# Patient Record
Sex: Female | Born: 1990 | Race: Black or African American | Hispanic: No | Marital: Single | Smoking: Never smoker
Health system: Southern US, Community
[De-identification: ages and names within clinical notes are randomized; demographics above are authoritative.]

## PROBLEM LIST (undated history)

## (undated) DIAGNOSIS — Z789 Other specified health status: Secondary | ICD-10-CM

## (undated) HISTORY — DX: Other specified health status: Z78.9

## (undated) HISTORY — PX: NO PAST SURGERIES: SHX2092

---

## 2019-01-28 ENCOUNTER — Ambulatory Visit (INDEPENDENT_AMBULATORY_CARE_PROVIDER_SITE_OTHER): Payer: BC Managed Care – PPO | Admitting: *Deleted

## 2019-01-28 ENCOUNTER — Other Ambulatory Visit: Payer: Self-pay

## 2019-01-28 ENCOUNTER — Encounter: Payer: Self-pay | Admitting: General Practice

## 2019-01-28 VITALS — BP 120/83 | HR 86 | Temp 98.9°F | Ht 66.0 in | Wt 198.0 lb

## 2019-01-28 DIAGNOSIS — Z3201 Encounter for pregnancy test, result positive: Secondary | ICD-10-CM | POA: Diagnosis not present

## 2019-01-28 DIAGNOSIS — Z3400 Encounter for supervision of normal first pregnancy, unspecified trimester: Secondary | ICD-10-CM | POA: Insufficient documentation

## 2019-01-28 DIAGNOSIS — Z3200 Encounter for pregnancy test, result unknown: Secondary | ICD-10-CM

## 2019-01-28 LAB — POCT URINE PREGNANCY: Preg Test, Ur: POSITIVE — AB

## 2019-01-28 MED ORDER — VITAFOL GUMMIES 3.33-0.333-34.8 MG PO CHEW: 3.0000 | CHEWABLE_TABLET | Freq: Every day | ORAL | 12 refills | Status: AC

## 2019-01-28 NOTE — Patient Instructions (Addendum)
First Trimester of Pregnancy  The first trimester of pregnancy is from week 1 until the end of week 13 (months 1 through 3). During this time, your baby will begin to develop inside you. At 6-8 weeks, the eyes and face are formed, and the heartbeat can be seen on ultrasound. At the end of 12 weeks, all the baby's organs are formed. Prenatal care is all the medical care you receive before the birth of your baby. Make sure you get good prenatal care and follow all of your doctor's instructions. Follow these instructions at home: Medicines  Take over-the-counter and prescription medicines only as told by your doctor. Some medicines are safe and some medicines are not safe during pregnancy.  Take a prenatal vitamin that contains at least 600 micrograms (mcg) of folic acid.  If you have trouble pooping (constipation), take medicine that will make your stool soft (stool softener) if your doctor approves. Eating and drinking   Eat regular, healthy meals.  Your doctor will tell you the amount of weight gain that is right for you.  Avoid raw meat and uncooked cheese.  If you feel sick to your stomach (nauseous) or throw up (vomit): ? Eat 4 or 5 small meals a day instead of 3 large meals. ? Try eating a few soda crackers. ? Drink liquids between meals instead of during meals.  To prevent constipation: ? Eat foods that are high in fiber, like fresh fruits and vegetables, whole grains, and beans. ? Drink enough fluids to keep your pee (urine) clear or pale yellow. Activity  Exercise only as told by your doctor. Stop exercising if you have cramps or pain in your lower belly (abdomen) or low back.  Do not exercise if it is too hot, too humid, or if you are in a place of great height (high altitude).  Try to avoid standing for long periods of time. Move your legs often if you must stand in one place for a long time.  Avoid heavy lifting.  Wear low-heeled shoes. Sit and stand up straight.   You can have sex unless your doctor tells you not to. Relieving pain and discomfort  Wear a good support bra if your breasts are sore.  Take warm water baths (sitz baths) to soothe pain or discomfort caused by hemorrhoids. Use hemorrhoid cream if your doctor says it is okay.  Rest with your legs raised if you have leg cramps or low back pain.  If you have puffy, bulging veins (varicose veins) in your legs: ? Wear support hose or compression stockings as told by your doctor. ? Raise (elevate) your feet for 15 minutes, 3-4 times a day. ? Limit salt in your food. Prenatal care  Schedule your prenatal visits by the twelfth week of pregnancy.  Write down your questions. Take them to your prenatal visits.  Keep all your prenatal visits as told by your doctor. This is important. Safety  Wear your seat belt at all times when driving.  Make a list of emergency phone numbers. The list should include numbers for family, friends, the hospital, and police and fire departments. General instructions  Ask your doctor for a referral to a local prenatal class. Begin classes no later than at the start of month 6 of your pregnancy.  Ask for help if you need counseling or if you need help with nutrition. Your doctor can give you advice or tell you where to go for help.  Do not use hot  tubs, steam rooms, or saunas.  Do not douche or use tampons or scented sanitary pads.  Do not cross your legs for long periods of time.  Avoid all herbs and alcohol. Avoid drugs that are not approved by your doctor.  Do not use any tobacco products, including cigarettes, chewing tobacco, and electronic cigarettes. If you need help quitting, ask your doctor. You may get counseling or other support to help you quit.  Avoid cat litter boxes and soil used by cats. These carry germs that can cause birth defects in the baby and can cause a loss of your baby (miscarriage) or stillbirth.  Visit your dentist. At home,  brush your teeth with a soft toothbrush. Be gentle when you floss. Contact a doctor if:  You are dizzy.  You have mild cramps or pressure in your lower belly.  You have a nagging pain in your belly area.  You continue to feel sick to your stomach, you throw up, or you have watery poop (diarrhea).  You have a bad smelling fluid coming from your vagina.  You have pain when you pee (urinate).  You have increased puffiness (swelling) in your face, hands, legs, or ankles. Get help right away if:  You have a fever.  You are leaking fluid from your vagina.  You have spotting or bleeding from your vagina.  You have very bad belly cramping or pain.  You gain or lose weight rapidly.  You throw up blood. It may look like coffee grounds.  You are around people who have [place] measles, fifth disease, or chickenpox.  You have a very bad headache.  You have shortness of breath.  You have any kind of trauma, such as from a fall or a car accident. Summary  The first trimester of pregnancy is from week 1 until the end of week 13 (months 1 through 3).  To take care of yourself and your unborn baby, you will need to eat healthy meals, take medicines only if your doctor tells you to do so, and do activities that are safe for you and your baby.  Keep all follow-up visits as told by your doctor. This is important as your doctor will have to ensure that your baby is healthy and growing well. This information is not intended to replace advice given to you by your health care provider. Make sure you discuss any questions you have with your health care provider. Document Released: 11/22/2007 Document Revised: 09/26/2018 Document Reviewed: 06/13/2016 Elsevier Patient Education  2020 [employer]  Prenatal Care Prenatal care is health care during pregnancy. It helps you and your unborn baby (fetus) stay as healthy as possible. Prenatal care may be provided by a midwife, a family practice health  care provider, or a childbirth and pregnancy specialist (obstetrician). How does this affect me? During pregnancy, you will be closely monitored for any new conditions that might develop. To lower your risk of pregnancy complications, you and your health care provider will talk about any underlying conditions you have. How does this affect my baby? Early and consistent prenatal care increases the chance that your baby will be healthy during pregnancy. Prenatal care lowers the risk that your baby will be:  Born early (prematurely).  Smaller than expected at birth (small for gestational age). What can I expect at the first prenatal care visit? Your first prenatal care visit will likely be the longest. You should schedule your first prenatal care visit as soon as you know that you  are pregnant. Your first visit is a good time to talk about any questions or concerns you have about pregnancy. At your visit, you and your health care provider will talk about:  Your medical history, including: ? Any past pregnancies. ? Your family's medical history. ? The baby's father's medical history. ? Any long-term (chronic) health conditions you have and how you manage them. ? Any surgeries or procedures you have had. ? Any current over-the-counter or prescription medicines, herbs, or supplements you are taking.  Other factors that could pose a risk to your baby, including:  Your home setting and your stress levels, including: ? Exposure to abuse or violence. ? Household financial strain. ? Mental health conditions you have.  Your daily health habits, including diet and exercise. Your health care provider will also:  Measure your weight, height, and blood pressure.  Do a physical exam, including a pelvic and breast exam.  Perform blood tests and urine tests to check for: ? Urinary tract infection. ? Sexually transmitted infections (STIs). ? Low iron levels in your blood (anemia). ? Blood type and  certain proteins on red blood cells (Rh antibodies). ? Infections and immunity to viruses, such as hepatitis B and rubella. ? HIV (human immunodeficiency virus).  Do an ultrasound to confirm your baby's growth and development and to help predict your estimated due date (EDD). This ultrasound is done with a probe that is inserted into the vagina (transvaginal ultrasound).  Discuss your options for genetic screening.  Give you information about how to keep yourself and your baby healthy, including: ? Nutrition and taking vitamins. ? Physical activity. ? How to manage pregnancy symptoms such as nausea and vomiting (morning sickness). ? Infections and substances that may be harmful to your baby and how to avoid them. ? Food safety. ? Dental care. ? Working. ? Travel. ? Warning signs to watch for and when to call your health care provider. How often will I have prenatal care visits? After your first prenatal care visit, you will have regular visits throughout your pregnancy. The visit schedule is often as follows:  Up to week 28 of pregnancy: once every 4 weeks.  28-36 weeks: once every 2 weeks.  After 36 weeks: every week until delivery. Some women may have visits more or less often depending on any underlying health conditions and the health of the baby. Keep all follow-up and prenatal care visits as told by your health care provider. This is important. What happens during routine prenatal care visits? Your health care provider will:  Measure your weight and blood pressure.  Check for fetal heart sounds.  Measure the height of your uterus in your abdomen (fundal height). This may be measured starting around week 20 of pregnancy.  Check the position of your baby inside your uterus.  Ask questions about your diet, sleeping patterns, and whether you can feel the baby move.  Review warning signs to watch for and signs of labor.  Ask about any pregnancy symptoms you are having and  how you are dealing with them. Symptoms may include: ? Headaches. ? Nausea and vomiting. ? Vaginal discharge. ? Swelling. ? Fatigue. ? Constipation. ? Any discomfort, including back or pelvic pain. Make a list of questions to ask your health care provider at your routine visits. What tests might I have during prenatal care visits? You may have blood, urine, and imaging tests throughout your pregnancy, such as:  Urine tests to check for glucose, protein, or signs of infection.  Glucose tests to check for a form of diabetes that can develop during pregnancy (gestational diabetes mellitus). This is usually done around week 24 of pregnancy.  An ultrasound to check your baby's growth and development and to check for birth defects. This is usually done around week 20 of pregnancy.  A test to check for group B strep (GBS) infection. This is usually done around week 36 of pregnancy.  Genetic testing. This may include blood or imaging tests, such as an ultrasound. Some genetic tests are done during the first trimester and some are done during the second trimester. What else can I expect during prenatal care visits? Your health care provider may recommend getting certain vaccines during pregnancy. These may include:  A yearly flu shot (annual influenza vaccine). This is especially important if you will be pregnant during flu season.  Tdap (tetanus, diphtheria, pertussis) vaccine. Getting this vaccine during pregnancy can protect your baby from whooping cough (pertussis) after birth. This vaccine may be recommended between weeks 27 and 36 of pregnancy. Later in your pregnancy, your health care provider may give you information about:  Childbirth and breastfeeding classes.  Choosing a health care provider for your baby.  Umbilical cord banking.  Breastfeeding.  Birth control after your baby is born.  The hospital labor and delivery unit and how to tour it.  Registering at the hospital  before you go into labor. Where to find more information  Office on Women's Health: [place].cawomenshealth.gov  American Pregnancy Association: americanpregnancy.org  March of Dimes: marchofdimes.org Summary  Prenatal care helps you and your baby stay as healthy as possible during pregnancy.  Your first prenatal care visit will most likely be the longest.  You will have visits and tests throughout your pregnancy to monitor your health and your baby's health.  Bring a list of questions to your visits to ask your health care provider.  Make sure to keep all follow-up and prenatal care visits with your health care provider. This information is not intended to replace advice given to you by your health care provider. Make sure you discuss any questions you have with your health care provider. Document Released: 06/08/2003 Document Revised: 09/25/2018 Document Reviewed: 06/04/2017 Elsevier Patient Education  2020 [employer]  Warning Signs During Pregnancy A pregnancy lasts about 40 weeks, starting from the first day of your last period until the baby is born. Pregnancy is divided into three phases called trimesters.  The first trimester refers to week 1 through week 13 of pregnancy.  The second trimester is the start of week 14 through the end of week 27.  The third trimester is the start of week 28 until you deliver your baby. During each trimester of pregnancy, certain signs and symptoms may indicate a problem. Talk with your health care provider about your current health and any medical conditions you have. Make sure you know the symptoms that you should watch for and report. How does this affect me?  Warning signs in the first trimester While some changes during the first trimester may be uncomfortable, most do not represent a serious problem. Let your health care provider know if you have any of the following warning signs in the first trimester:  You cannot eat or drink without  vomiting, and this lasts for longer than a day.  You have vaginal bleeding or spotting along with menstrual-like cramping.  You have diarrhea for longer than a day.  You have a fever or other signs of infection, such  as: ? Pain or burning when you urinate. ? Foul smelling or thick or yellowish vaginal discharge. Warning signs in the second trimester As your baby grows and changes during the second trimester, there are additional signs and symptoms that may indicate a problem. These include:  Signs and symptoms of infection, including a fever.  Signs or symptoms of a miscarriage or preterm labor, such as regular contractions, menstrual-like cramping, or lower abdominal pain.  Bloody or watery vaginal discharge or obvious vaginal bleeding.  Feeling like your heart is pounding.  Having trouble breathing.  Nausea, vomiting, or diarrhea that lasts for longer than a day.  Craving non-food items, such as clay, chalk, or dirt. This may be a sign of a very treatable medical condition called pica. Later in your second trimester, watch for signs and symptoms of a serious medical condition called preeclampsia.These include:  Changes in your vision.  A severe headache that does not go away.  Nausea and vomiting. It is also important to notice if your baby stops moving or moves less than usual during this time. Warning signs in the third trimester As you approach the third trimester, your baby is growing and your body is preparing for the birth of your baby. In your third trimester, be sure to let your health care provider know if:  You have signs and symptoms of infection, including a fever.  You have vaginal bleeding.  You notice that your baby is moving less than usual or is not moving.  You have nausea, vomiting, or diarrhea that lasts for longer than a day.  You have a severe headache that does not go away.  You have vision changes, including seeing spots or having blurry or  double vision.  You have increased swelling in your hands or face. How does this affect my baby? Throughout your pregnancy, always report any of the warning signs of a problem to your health care provider. This can help prevent complications that may affect your baby, including:  Increased risk for premature birth.  Infection that may be transmitted to your baby.  Increased risk for stillbirth. Contact a health care provider if:  You have any of the warning signs of a problem for the current trimester of your pregnancy.  Any of the following apply to you during any trimester of pregnancy: ? You have strong emotions, such as sadness or anxiety, that interfere with work or personal relationships. ? You feel unsafe in your home and need help finding a safe place to live. ? You are using tobacco products, alcohol, or drugs and you need help to stop. Get help right away if: You have signs or symptoms of labor before 37 weeks of pregnancy. These include:  Contractions that are 5 minutes or less apart, or that increase in frequency, intensity, or length.  Sudden, sharp abdominal pain or low back pain.  Uncontrolled gush or trickle of fluid from your vagina. Summary  A pregnancy lasts about 40 weeks, starting from the first day of your last period until the baby is born. Pregnancy is divided into three phases called trimesters. Each trimester has warning signs to watch for.  Always report any warning signs to your health care provider in order to prevent complications that may affect both you and your baby.  Talk with your health care provider about your current health and any medical conditions you have. Make sure you know the symptoms that you should watch for and report. This information is not intended  to replace advice given to you by your health care provider. Make sure you discuss any questions you have with your health care provider. Document Released: 03/22/2017 Document Revised:  09/24/2018 Document Reviewed: 03/22/2017 Elsevier Patient Education  2020 [employer].  Eating Plan for Pregnant Women While you are pregnant, your body requires additional nutrition to help support your growing baby. You also have a higher need for some vitamins and minerals, such as folic acid, calcium, iron, and vitamin D. Eating a healthy, well-balanced diet is very important for your health and your baby's health. Your need for extra calories varies for the three 74-month segments of your pregnancy (trimesters). For most women, it is recommended to consume:  150 extra calories a day during the first trimester.  300 extra calories a day during the second trimester.  300 extra calories a day during the third trimester. What are tips for following this plan?   Do not try to lose weight or go on a diet during pregnancy.  Limit your overall intake of foods that have "empty calories." These are foods that have little nutritional value, such as sweets, desserts, candies, and sugar-sweetened beverages.  Eat a variety of foods (especially fruits and vegetables) to get a full range of vitamins and minerals.  Take a prenatal vitamin to help meet your additional vitamin and mineral needs during pregnancy, specifically for folic acid, iron, calcium, and vitamin D.  Remember to stay active. Ask your health care provider what types of exercise and activities are safe for you.  Practice good food safety and cleanliness. Wash your hands before you eat and after you prepare raw meat. Wash all fruits and vegetables well before peeling or eating. Taking these actions can help to prevent food-borne illnesses that can be very dangerous to your baby, such as listeriosis. Ask your health care provider for more information about listeriosis. What does 150 extra calories look like? Healthy options that provide 150 extra calories each day could be any of the following:  6-8 oz (170-230 g) of plain low-fat  yogurt with  cup of berries.  1 apple with 2 teaspoons (11 g) of peanut butter.  Cut-up vegetables with  cup (60 g) of hummus.  8 oz (230 mL) or 1 cup of low-fat chocolate milk.  1 stick of string cheese with 1 medium orange.  1 peanut butter and jelly sandwich that is made with one slice of whole-wheat bread and 1 tsp (5 g) of peanut butter. For 300 extra calories, you could eat two of those healthy options each day. What is a healthy amount of weight to gain? The right amount of weight gain for you is based on your BMI before you became pregnant. If your BMI:  Was less than 18 (underweight), you should gain 28-40 lb (13-18 kg).  Was 18-24.9 (normal), you should gain 25-35 lb (11-16 kg).  Was 25-29.9 (overweight), you should gain 15-25 lb (7-11 kg).  Was 30 or greater (obese), you should gain 11-20 lb (5-9 kg). What if I am having twins or multiples? Generally, if you are carrying twins or multiples:  You may need to eat 300-600 extra calories a day.  The recommended range for total weight gain is 25-54 lb (11-25 kg), depending on your BMI before pregnancy.  Talk with your health care provider to find out about nutritional needs, weight gain, and exercise that is right for you. What foods can I eat?  Grains All grains. Choose whole grains, such as whole-wheat  bread, oatmeal, or brown rice. Vegetables All vegetables. Eat a variety of colors and types of vegetables. Remember to wash your vegetables well before peeling or eating. Fruits All fruits. Eat a variety of colors and types of fruit. Remember to wash your fruits well before peeling or eating. Meats and other protein foods Lean meats, including chicken, [place], fish, and lean cuts of beef, veal, or pork. If you eat fish or seafood, choose options that are higher in omega-3 fatty acids and lower in mercury, such as salmon, herring, mussels, trout, sardines, pollock, shrimp, crab, and lobster. Tofu. Tempeh. Beans. Eggs.  Peanut butter and other nut butters. Make sure that all meats, poultry, and eggs are cooked to food-safe temperatures or "well-done." Two or more servings of fish are recommended each week in order to get the most benefits from omega-3 fatty acids that are found in seafood. Choose fish that are lower in mercury. You can find more information online:  [employer] Dairy Pasteurized milk and milk alternatives (such as almond milk). Pasteurized yogurt and pasteurized cheese. Cottage cheese. Sour cream. Beverages Water. Juices that contain 100% fruit juice or vegetable juice. Caffeine-free teas and decaffeinated coffee. Drinks that contain caffeine are okay to drink, but it is better to avoid caffeine. Keep your total caffeine intake to less than 200 mg each day (which is 12 oz or 355 mL of coffee, tea, or soda) or the limit as told by your health care provider. Fats and oils Fats and oils are okay to include in moderation. Sweets and desserts Sweets and desserts are okay to include in moderation. Seasoning and other foods All pasteurized condiments. The items listed above may not be a complete list of recommended foods and beverages. Contact your dietitian for more options. The items listed above may not be a complete list of foods and beverages [you/your child] can eat. Contact a dietitian for more information. What foods are not recommended? Vegetables Raw (unpasteurized) vegetable juices. Fruits Unpasteurized fruit juices. Meats and other protein foods Lunch meats, bologna, hot dogs, or other deli meats. (If you must eat those meats, reheat them until they are steaming hot.) Refrigerated pat, meat spreads from a meat counter, smoked seafood that is found in the refrigerated section of a store. Raw or undercooked meats, poultry, and eggs. Raw fish, such as sushi or sashimi. Fish that have high mercury content, such as tilefish, shark, swordfish, and king mackerel. To learn more about mercury  in fish, talk with your health care provider or look for online resources, such as:  [employer] Dairy Raw (unpasteurized) milk and any foods that have raw milk in them. Soft cheeses, such as feta, queso blanco, queso fresco, Brie, Camembert cheeses, blue-veined cheeses, and Panela cheese (unless it is made with pasteurized milk, which must be stated on the label). Beverages Alcohol. Sugar-sweetened beverages, such as sodas, teas, or energy drinks. Seasoning and other foods Homemade fermented foods and drinks, such as pickles, sauerkraut, or kombucha drinks. (Store-bought pasteurized versions of these are okay.) Salads that are made in a store or deli, such as ham salad, chicken salad, egg salad, tuna salad, and seafood salad. The items listed above may not be a complete list of foods and beverages to avoid. Contact your dietitian for more information. The items listed above may not be a complete list of foods and beverages [you/your child] should avoid. Contact a dietitian for more information. Where to find more information To calculate the number of calories you need based on your  height, weight, and activity level, you can use an online calculator such as:  PackageNews.iswww.choosemyplate.gov/MyPlatePlan To calculate how much weight you should gain during pregnancy, you can use an online pregnancy weight gain calculator such as:  http://jones-berg.com/www.choosemyplate.gov/pregnancy-weight-gain-calculator Summary  While you are pregnant, your body requires additional nutrition to help support your growing baby.  Eat a variety of foods, especially fruits and vegetables to get a full range of vitamins and minerals.  Practice good food safety and cleanliness. Wash your hands before you eat and after you prepare raw meat. Wash all fruits and vegetables well before peeling or eating. Taking these actions can help to prevent food-borne illnesses, such as listeriosis, that can be very dangerous to your baby.  Do not eat raw  meat or fish. Do not eat fish that have high mercury content, such as tilefish, shark, swordfish, and king mackerel. Do not eat unpasteurized (raw) dairy.  Take a prenatal vitamin to help meet your additional vitamin and mineral needs during pregnancy, specifically for folic acid, iron, calcium, and vitamin D. This information is not intended to replace advice given to you by your health care provider. Make sure you discuss any questions you have with your health care provider. Document Released: 03/20/2014 Document Revised: 09/26/2018 Document Reviewed: 03/02/2017 Elsevier Patient Education  2020 [employer]

## 2019-01-28 NOTE — Progress Notes (Signed)
   PRENATAL INTAKE SUMMARY  Nancy Hatfield presents today New OB Nurse Interview.  OB History    Gravida  2   Para      Term      Preterm      AB  1   Living        SAB      TAB      Ectopic      Multiple      Live Births             I have reviewed the patient's medical, obstetrical, social, and family histories, medications, and available lab results.  SUBJECTIVE She has no unusual complaints.  OBJECTIVE Initial history (New OB).  EDD: 09/24/2019 by LMP G2P0010 GA:[redacted]w[redacted]d  GENERAL APPEARANCE: alert, well appearing, in no apparent distress, oriented to person, place and time   ASSESSMENT Normal pregnancy. Positive Pregnancy test today.  PLAN Prenatal care- [facility] to be completed at next visit with midwife  Derl Barrow, RN

## 2019-03-12 ENCOUNTER — Other Ambulatory Visit: Payer: Self-pay | Admitting: Advanced Practice Midwife

## 2019-03-12 ENCOUNTER — Ambulatory Visit (INDEPENDENT_AMBULATORY_CARE_PROVIDER_SITE_OTHER): Payer: BC Managed Care – PPO | Admitting: Advanced Practice Midwife

## 2019-03-12 ENCOUNTER — Other Ambulatory Visit: Payer: Self-pay

## 2019-03-12 ENCOUNTER — Encounter: Payer: Self-pay | Admitting: Advanced Practice Midwife

## 2019-03-12 VITALS — BP 125/81 | HR 101 | Temp 98.8°F | Wt 200.5 lb

## 2019-03-12 DIAGNOSIS — Z3A12 12 weeks gestation of pregnancy: Secondary | ICD-10-CM

## 2019-03-12 DIAGNOSIS — Z113 Encounter for screening for infections with a predominantly sexual mode of transmission: Secondary | ICD-10-CM | POA: Diagnosis not present

## 2019-03-12 DIAGNOSIS — Z3481 Encounter for supervision of other normal pregnancy, first trimester: Secondary | ICD-10-CM

## 2019-03-12 DIAGNOSIS — Z3400 Encounter for supervision of normal first pregnancy, unspecified trimester: Secondary | ICD-10-CM

## 2019-03-12 DIAGNOSIS — Z124 Encounter for screening for malignant neoplasm of cervix: Secondary | ICD-10-CM

## 2019-03-12 DIAGNOSIS — Z3480 Encounter for supervision of other normal pregnancy, unspecified trimester: Secondary | ICD-10-CM

## 2019-03-12 NOTE — Progress Notes (Signed)
Subjective:   Addisynn Vassell is a 28 y.o. G2P0010 at [redacted]w[redacted]d by LMP being seen today for her first obstetrical visit.  Her obstetrical history is significant for none. Patient does intend to breast feed. Pregnancy history fully reviewed.  Patient reports no complaints.  HISTORY: OB History  Gravida Para Term Preterm AB Living  2 0 0 0 1 0  SAB TAB Ectopic Multiple Live Births  0 0 0 0 0    # Outcome Date GA Lbr Len/2nd Weight Sex Delivery Anes PTL Lv  2 Current           1 AB             Last pap smear was done 2018 and was normal per patient.   Past Medical History:  Diagnosis Date  . Medical history non-contributory    Past Surgical History:  Procedure Laterality Date  . NO PAST SURGERIES     Family History  Problem Relation Age of Onset  . Prostate cancer Father   . Hypertension Mother    Social History   Tobacco Use  . Smoking status: Never Smoker  . Smokeless tobacco: Never Used  Substance Use Topics  . Alcohol use: Not Currently  . Drug use: Never   Not on File Current Outpatient Medications on File Prior to Visit  Medication Sig Dispense Refill  . Prenatal Vit-Fe Phos-FA-Omega (VITAFOL GUMMIES) 3.33-0.333-34.8 MG CHEW Chew 3 each by mouth daily. 90 tablet 12   No current facility-administered medications on file prior to visit.     Review of Systems Pertinent items noted in HPI and remainder of comprehensive ROS otherwise negative.  Exam   Vitals:   03/12/19 1549  BP: 125/81  Pulse: (!) 101  Temp: 98.8 F (37.1 C)  Weight: 200 lb 8 oz (90.9 kg)     Physical Exam  Constitutional: She is oriented to person, place, and time and well-developed, well-nourished, and in no distress. No distress.  HENT:  Head: Normocephalic.  Cardiovascular: Normal rate.  Pulmonary/Chest: Effort normal.  Abdominal: Soft. There is no abdominal tenderness.  Genitourinary:    Genitourinary Comments:  External: no lesion Vagina: small amount of white  discharge Cervix: pink, smooth, no CMT Uterus: AGA Adnexa: NT    Neurological: She is alert and oriented to person, place, and time.  Skin: Skin is warm and dry.  Psychiatric: Affect normal.  Nursing note and vitals reviewed.   Assessment:   Pregnancy: G2P0010 Patient Active Problem List   Diagnosis Date Noted  . Supervision of normal first pregnancy, antepartum 01/28/2019     Plan:  1. Supervision of other normal pregnancy, antepartum - Routine care - Obstetric Panel, Including HIV - Culture, OB Urine - Genetic Screening - Enroll Patient in Babyscripts - Cytology - PAP( [place]) - Cervicovaginal ancillary only( [place]) - Next visit in 4 weeks can be virtual - Future orders placed for AFP and Anatomy US. Patient to get scheduled for lab visit on [place] day so that AFP can be drawn at that visit.    Initial labs drawn. Continue prenatal vitamins. Genetic Screening discussed, AFP and NIPS: requested. Ultrasound discussed; fetal anatomic survey: requested. Problem list reviewed and updated. The nature of [facility] with multiple MDs and other Advanced Practice Providers was explained to patient; also emphasized that residents, students are part of our team. Routine obstetric precautions reviewed. 50% of 45 min visit spent in counseling and coordination of care. Return in  about 4 weeks (around 04/09/2019) for virtual visit .  Marcille Buffy DNP, CNM  03/12/19  3:58 PM

## 2019-03-13 LAB — OBSTETRIC PANEL, INCLUDING HIV
Antibody Screen: NEGATIVE
Basophils Absolute: 0 10*3/uL (ref 0.0–0.2)
Basos: 1 %
EOS (ABSOLUTE): 0.1 10*3/uL (ref 0.0–0.4)
Eos: 2 %
HIV Screen 4th Generation wRfx: NONREACTIVE
Hematocrit: 34.5 % (ref 34.0–46.6)
Hemoglobin: 12 g/dL (ref 11.1–15.9)
Hepatitis B Surface Ag: NEGATIVE
Immature Grans (Abs): 0 10*3/uL (ref 0.0–0.1)
Immature Granulocytes: 0 %
Lymphocytes Absolute: 2.5 10*3/uL (ref 0.7–3.1)
Lymphs: 38 %
MCH: 31 pg (ref 26.6–33.0)
MCHC: 34.8 g/dL (ref 31.5–35.7)
MCV: 89 fL (ref 79–97)
Monocytes Absolute: 0.5 10*3/uL (ref 0.1–0.9)
Monocytes: 8 %
Neutrophils Absolute: 3.4 10*3/uL (ref 1.4–7.0)
Neutrophils: 51 %
Platelets: 242 10*3/uL (ref 150–450)
RBC: 3.87 x10E6/uL (ref 3.77–5.28)
RDW: 13.1 % (ref 11.7–15.4)
RPR Ser Ql: NONREACTIVE
Rh Factor: POSITIVE
Rubella Antibodies, IGG: 4.26 index (ref >0.99)
WBC: 6.6 10*3/uL (ref 3.4–10.8)

## 2019-03-14 LAB — URINE CULTURE, OB REFLEX

## 2019-03-14 LAB — CERVICOVAGINAL ANCILLARY ONLY
Chlamydia: NEGATIVE
Neisseria Gonorrhea: NEGATIVE

## 2019-03-14 LAB — CULTURE, OB URINE

## 2019-03-14 LAB — CYTOLOGY - PAP: Diagnosis: NEGATIVE

## 2019-03-17 ENCOUNTER — Encounter: Payer: Self-pay | Admitting: General Practice

## 2019-03-20 ENCOUNTER — Encounter: Payer: Self-pay | Admitting: General Practice

## 2019-03-25 ENCOUNTER — Encounter: Payer: Self-pay | Admitting: General Practice

## 2019-04-09 ENCOUNTER — Other Ambulatory Visit: Payer: Self-pay

## 2019-04-09 ENCOUNTER — Telehealth (INDEPENDENT_AMBULATORY_CARE_PROVIDER_SITE_OTHER): Payer: BC Managed Care – PPO

## 2019-04-09 DIAGNOSIS — Z3400 Encounter for supervision of normal first pregnancy, unspecified trimester: Secondary | ICD-10-CM

## 2019-04-09 DIAGNOSIS — Z3A16 16 weeks gestation of pregnancy: Secondary | ICD-10-CM

## 2019-04-09 DIAGNOSIS — O26892 Other specified pregnancy related conditions, second trimester: Secondary | ICD-10-CM

## 2019-04-09 DIAGNOSIS — R519 Headache, unspecified: Secondary | ICD-10-CM | POA: Insufficient documentation

## 2019-04-09 NOTE — Progress Notes (Signed)
TELEHEALTH OBSTETRICS PRENATAL VIRTUAL VIDEO VISIT ENCOUNTER NOTE  Provider location: [facility]   I connected with [name] on 04/09/19 at  9:30 AM EDT by MyChart Video Encounter at home and verified that I am speaking with the correct person using two identifiers.   I discussed the limitations, risks, security and privacy concerns of performing an evaluation and management service virtually and the availability of in person appointments. I also discussed with the patient that there may be a patient responsible charge related to this service. The patient expressed understanding and agreed to proceed. Subjective:  Nancy Hatfield is a 28 y.o. G2P0010 at [redacted]w[redacted]d being seen today for ongoing prenatal care.  She is currently monitored for the following issues for this low-risk pregnancy and has Supervision of normal first pregnancy, antepartum on their problem list.  Patient reports intermittent headaches that occur upon awaking.  Patient states the headaches are 4-5/10, but she does not usually take medications when they occur.  Patient goes on to state that when she does take tylenol she usually goes to sleep after and does not wake up with a headache.  Patient reports that the headaches occur about every other week and is a dull sensation in the back of her head or above her eyes.  Patient reports "trying" to drink 3 bottles of water daily and states on a good day she does as well as 2 bottles of sparkling water. Patient reports getting about 9 hrs of sleep daily.  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   Contractions: Not present. Vag. Bleeding: None.  Movement: Absent. Denies any leaking of fluid.   The following portions of the patient's history were reviewed and updated as appropriate: allergies, current medications, past family history, past medical history, past  social history, past surgical history and problem list.   Objective:  There were no vitals filed for this visit.  Fetal Status:     Movement: Absent     General:  Alert, oriented and cooperative. Patient is in no acute distress.  Respiratory: Normal respiratory effort, no problems with respiration noted  Mental Status: Normal mood and affect. Normal behavior. Normal judgment and thought content.  Rest of physical exam deferred due to type of encounter  Imaging: No results found.  Assessment and Plan:  Pregnancy: G2P0010 at [redacted]w[redacted]d 1. Supervision of normal first pregnancy, antepartum -Anticipatory guidance for upcoming appts. -Reviewed labs and informed no need for iron supplementation at current. -Informed that iron level will be rechecked at 28 week visit along with GTT. -Reviewed GTT including what to expect and how it's performed.  2. Headache in pregnancy, antepartum, second trimester -Reassured that it is common to have increase in headaches during pregnancy s/t fluctuations in hormones. -Encouraged to continue current mgmt and report no improvement with interventions or increase in frequency and/or duration of HA. -Reviewed how stressors (pregnancy, covid, grad school, and personal life) can contribute to headache onset and frequency. -Reviewed and discussed proper hydration throughout the day to avoid headaches. -Discussed that if change in headache pattern would refer to neurology and/or start alternate medications.   Preterm labor symptoms and general obstetric precautions including but not limited to vaginal bleeding, contractions, leaking of fluid and fetal movement were reviewed in detail with the patient. I discussed the assessment and treatment plan with the patient. The patient was provided an opportunity to ask questions and  all were answered. The patient agreed with the plan and demonstrated an understanding of the instructions. The patient was advised to call back or  seek an in-person office evaluation/go to MAU at [facility] for any urgent or concerning symptoms. Please refer to After Visit Summary for other counseling recommendations.   I provided 20 minutes of face-to-face time during this encounter.    Future Appointments  Date Time Provider [place]  04/30/2019  9:30 AM WOC-WOCA LAB WOC-WOCA WOC  04/30/2019 10:15 AM WH-MFC [place] 4 WH-MFCUS MFC-US  05/07/2019  1:10 PM Gavin Pound, [place], [place] for [employer], [place] [facility]

## 2019-04-24 ENCOUNTER — Other Ambulatory Visit: Payer: Self-pay | Admitting: Lactation Services

## 2019-04-24 DIAGNOSIS — Z3400 Encounter for supervision of normal first pregnancy, unspecified trimester: Secondary | ICD-10-CM

## 2019-04-24 NOTE — Progress Notes (Signed)
Lab orders

## 2019-04-30 ENCOUNTER — Other Ambulatory Visit: Payer: Self-pay | Admitting: Advanced Practice Midwife

## 2019-04-30 ENCOUNTER — Other Ambulatory Visit: Payer: Self-pay

## 2019-04-30 ENCOUNTER — Ambulatory Visit (HOSPITAL_COMMUNITY)
Admission: RE | Admit: 2019-04-30 | Discharge: 2019-04-30 | Disposition: A | Discharge status: Home / Self Care | Payer: BC Managed Care – PPO | Source: Ambulatory Visit | Attending: Advanced Practice Midwife | Admitting: Advanced Practice Midwife

## 2019-04-30 ENCOUNTER — Other Ambulatory Visit: Payer: BC Managed Care – PPO

## 2019-04-30 ENCOUNTER — Other Ambulatory Visit (HOSPITAL_COMMUNITY): Payer: Self-pay | Admitting: *Deleted

## 2019-04-30 DIAGNOSIS — Z3400 Encounter for supervision of normal first pregnancy, unspecified trimester: Secondary | ICD-10-CM

## 2019-04-30 DIAGNOSIS — O99212 Obesity complicating pregnancy, second trimester: Secondary | ICD-10-CM

## 2019-04-30 DIAGNOSIS — Z3A18 18 weeks gestation of pregnancy: Secondary | ICD-10-CM | POA: Diagnosis not present

## 2019-04-30 DIAGNOSIS — Z3480 Encounter for supervision of other normal pregnancy, unspecified trimester: Secondary | ICD-10-CM

## 2019-04-30 DIAGNOSIS — Z362 Encounter for other antenatal screening follow-up: Secondary | ICD-10-CM

## 2019-05-03 LAB — AFP, SERUM, OPEN SPINA BIFIDA
AFP MoM: 1.01
AFP Value: 45.9 ng/mL
Gest. Age on Collection Date: 19 weeks
Maternal Age At EDD: 29.3 yr
OSBR Risk 1 IN: 10000
Test Results:: NEGATIVE
Weight: 202 [lb_av]

## 2019-05-07 ENCOUNTER — Other Ambulatory Visit: Payer: Self-pay

## 2019-05-07 ENCOUNTER — Telehealth (INDEPENDENT_AMBULATORY_CARE_PROVIDER_SITE_OTHER): Payer: BC Managed Care – PPO

## 2019-05-07 VITALS — BP 111/89 | HR 84 | Wt 205.2 lb

## 2019-05-07 DIAGNOSIS — O99210 Obesity complicating pregnancy, unspecified trimester: Secondary | ICD-10-CM | POA: Insufficient documentation

## 2019-05-07 DIAGNOSIS — O26892 Other specified pregnancy related conditions, second trimester: Secondary | ICD-10-CM

## 2019-05-07 DIAGNOSIS — O99212 Obesity complicating pregnancy, second trimester: Secondary | ICD-10-CM

## 2019-05-07 DIAGNOSIS — Z3400 Encounter for supervision of normal first pregnancy, unspecified trimester: Secondary | ICD-10-CM

## 2019-05-07 DIAGNOSIS — E669 Obesity, unspecified: Secondary | ICD-10-CM | POA: Insufficient documentation

## 2019-05-07 DIAGNOSIS — Z3A20 20 weeks gestation of pregnancy: Secondary | ICD-10-CM

## 2019-05-07 DIAGNOSIS — R519 Headache, unspecified: Secondary | ICD-10-CM

## 2019-05-07 DIAGNOSIS — Z9189 Other specified personal risk factors, not elsewhere classified: Secondary | ICD-10-CM

## 2019-05-07 MED ORDER — ASPIRIN 81 MG PO CHEW: 81.0000 mg | CHEWABLE_TABLET | Freq: Every day | ORAL | 1 refills | Status: DC

## 2019-05-07 NOTE — Patient Instructions (Signed)

## 2019-05-07 NOTE — Progress Notes (Signed)
TELEHEALTH OBSTETRICS PRENATAL VIRTUAL VIDEO VISIT ENCOUNTER NOTE  Provider location: [facility]   I connected with [name] on 05/07/19 at 11:10 AM EST by MyChart Video Encounter at home and verified that I am speaking with the correct person using two identifiers.   I discussed the limitations, risks, security and privacy concerns of performing an evaluation and management service virtually and the availability of in person appointments. I also discussed with the patient that there may be a patient responsible charge related to this service. The patient expressed understanding and agreed to proceed. Subjective:  Nancy Hatfield is a 28 y.o. G2P0010 at [redacted]w[redacted]d being seen today for ongoing prenatal care.  She is currently monitored for the following issues for this low-risk pregnancy and has Supervision of normal first pregnancy, antepartum; Headache in pregnancy, antepartum, second trimester; and Obesity in pregnancy on their problem list.  Patient reports headache.  She states that she continues to get daily mild headaches despite adequate sleep and hydration.  She reports fetal movement that is perceived as "flutters or tickles."  She reports that she has been feeling very emotional lately.  She reports immense dissatisfaction with her anatomy US experience and the inability to have her SO at the bedside or present via phone. She states she felt technicians and front office staff lacked compassion although she understood the reason for not having her SO present.  She also questioned why her SO could not be present via facetime and went on to state that exceptions should be made in setting of CoVid.  In regards to overall health, patient reports increased feeling of hunger and questions if this is normal.   Contractions: Not present. Vag. Bleeding: None.  Movement: Present. Denies any leaking of fluid.   The following portions of the patient's history were  reviewed and updated as appropriate: allergies, current medications, past family history, past medical history, past social history, past surgical history and problem list.   Objective:   Vitals:   05/07/19 1116  BP: 111/89  Pulse: 84  Weight: 205 lb 3.2 oz (93.1 kg)    Fetal Status:     Movement: Present     General:  Alert, oriented and cooperative. Patient is in no acute distress.  Respiratory: Normal respiratory effort, no problems with respiration noted  Mental Status: Normal mood and affect. Normal behavior. Normal judgment and thought content.  Rest of physical exam deferred due to type of encounter  Imaging: [place] Mfm Ob Detail +14 Wk  Result Date: 04/30/2019 ----------------------------------------------------------------------  OBSTETRICS REPORT                       (Signed Final 04/30/2019 12:15 pm) ---------------------------------------------------------------------- Patient Info  ID #:       [id number]                          D.O.B.:  04-23-1991 (28 yrs)  Name:       Nancy Hatfield                  Visit Date: 04/30/2019 10:44 am ---------------------------------------------------------------------- Performed By  Performed By:     Emeline Darling BS,      Ref. Address:     [address]                    [place],                                                             [place] [address]  Attending:        Noralee Space MD        [facility]  Referred By:      Armando Reichert CNM ---------------------------------------------------------------------- Orders   #  Description                          Code         Ordered By   1  [place] MFM OB DETAIL +14 WK              76811.01     Thressa Sheller  ----------------------------------------------------------------------   #  Order #                    Accession #                  Episode #   1  [id number]                  [id number]                  [id number]  ---------------------------------------------------------------------- Indications   Obesity complicating pregnancy, second         O99.212   trimester   Encounter for antenatal screening for          Z36.3   malformations   Negative Horizon (Low Risk NIPS)   [redacted] weeks gestation of pregnancy                Z3A.18  ---------------------------------------------------------------------- Vital Signs  Weight (lb): 200                               Height:        5'6"  BMI:         32.28 ---------------------------------------------------------------------- Fetal Evaluation  Num Of Fetuses:         1  Fetal Heart Rate(bpm):  155  Cardiac Activity:       Observed  Presentation:           Cephalic  Placenta:               Anterior  P. Cord Insertion:      Visualized  Amniotic Fluid  AFI FV:      Within normal limits                              Largest Pocket(cm)  6.1 ---------------------------------------------------------------------- Biometry  BPD:      40.9  mm     G. Age:  18w 3d         63  %    CI:        76.46   %    70 - 86                                                          FL/HC:      17.9   %    15.8 - 18  HC:      148.2  mm     G. Age:  18w 0d         31  %    HC/AC:      1.16        1.07 - 1.29  AC:      127.4  mm     G. Age:  18w 2d         53  %    FL/BPD:     64.8   %  FL:       26.5  mm     G. Age:  18w 0d         41  %    FL/AC:      20.8   %    20 - 24  Est. FW:     228  gm      0 lb 8 oz     48  % ---------------------------------------------------------------------- OB History  Gravidity:    2         Term:   0        Prem:   0        SAB:   1  TOP:          0       Ectopic:  0        Living: 0 ---------------------------------------------------------------------- Gestational Age  LMP:           19w 0d        Date:  12/18/18                 EDD:   09/24/19  U/S Today:     18w 1d                                         EDD:   09/30/19  Best:          18w 1d     Det. By:  U/S (04/30/19)           EDD:   09/30/19 ---------------------------------------------------------------------- Anatomy  Cranium:               Appears normal         LVOT:                   Not well visualized  Cavum:                 Not well visualized    Aortic Arch:            Appears normal  Ventricles:  Not well visualized    Ductal Arch:            Appears normal  Choroid Plexus:        Appears normal         Diaphragm:              Appears normal  Cerebellum:            Previously seen        Stomach:                Appears normal, left                                                                        sided  Posterior Fossa:       Previously seen        Abdomen:                Appears normal  Nuchal Fold:           Previously seen        Abdominal Wall:         Appears nml (cord                                                                        insert, abd wall)  Face:                  Appears normal         Cord Vessels:           Appears normal (3                         (orbits and profile)                           vessel cord)  Lips:                  Not well visualized    Kidneys:                Appear normal  Palate:                Not well visualized    Bladder:                Appears normal  Thoracic:              Appears normal         Spine:                  Appears normal  Heart:                 Appears normal         Upper Extremities:      Appears normal                         (  4CH, axis, and                         situs)  RVOT:                  Not well visualized    Lower Extremities:      Appears normal  Other:  Female gender. Heels visualized. Technically difficult due to maternal          habitus and fetal position. ---------------------------------------------------------------------- Cervix Uterus Adnexa  Cervix  Length:              3  cm.  Normal appearance by transabdominal  scan. ---------------------------------------------------------------------- Impression  We performed fetal anatomy scan. No makers of  aneuploidies or fetal structural defects are seen. Fetal  biometry is consistent with her previously-established dates.  Amniotic fluid is normal and good fetal activity is seen.  Patient understands the limitations of ultrasound in detecting  fetal anomalies.  On cell-free fetal DNA screening, the risks of fetal  aneuploidies are not increased. ---------------------------------------------------------------------- Recommendations  -An appointment was made for her to return in 4 weeks for  completion of fetal anatomy. ----------------------------------------------------------------------                  Noralee Space, MD Electronically Signed Final Report   04/30/2019 12:15 pm ----------------------------------------------------------------------   Assessment and Plan:  Pregnancy: G2P0010 at [redacted]w[redacted]d 1. Supervision of normal first pregnancy, antepartum -Anticipatory guidance for upcoming appts. -Informed that next appt would be a virtual visit f/b in office visit for GTT. -Discussed that appt in 8 weeks would last at least 2.5 hours.  -Reiterated social distancing precautions during upcoming holiday season.  Patient informed that she is considered immunocompromised during pregnancy. -Reviewed ultrasound results including incomplete findings as reason for repeat in 4 weeks.  -Discussed taking blood pressures at least once a week, but okay to take when headaches occur.   2. Headache in pregnancy, antepartum, second trimester -Discussed plan to place neurology referral for assessment of headaches. -Reassured that it is normal to have an increase in HA during pregnancy, but the concern is with the daily occurrence despite the intensity. -Patient agreeable with plan and has no questions or concerns.  - Ambulatory referral to Neurology  3. Obesity in pregnancy -Reviewed  current weight and informed of overall weight gain of 5lbs. -Discussed weight gain goal of 11-20lbs max. -Informed that it is normal to experience periods of increased hunger throughout the pregnancy. -Discussed making health conscious meal and snack choices. -Discussed protocol of bASA during pregnancy to reduce risk of PreEclampsia. Reviewed current risk factors for PreEclampsia including weight, AA descent, and nulliparity. -Patient agreeable and questions addressed. -Rx for baby aspirin sent to pharmacy on file.   -Will receive CMP at next in office visit.  4. At risk for dissatisfaction with healthcare -Apologies given for patient's experience during anatomy US. -Reassured that all patient's are under the same restrictions. -Informed that cellphone policy was established prior to COVID 19. -Patient informed that complaint would be filed on her behalf.   Preterm labor symptoms and general obstetric precautions including but not limited to vaginal bleeding, contractions, leaking of fluid and fetal movement were reviewed in detail with the patient. I discussed the assessment and treatment plan with the patient. The patient was provided an opportunity to ask questions and all were answered. The patient agreed with the plan and demonstrated an understanding of the instructions. The patient was advised  to call back or seek an in-person office evaluation/go to MAU at [facility] for any urgent or concerning symptoms. Please refer to After Visit Summary for other counseling recommendations.   I provided 31 minutes of face-to-face time during this encounter.  No follow-ups on file.  Future Appointments  Date Time Provider Department Center  05/28/2019 11:15 AM WH-MFC [place] 4 WH-MFCUS MFC-US  05/28/2019 11:20 AM WH-MFC NURSE WH-MFC MFC-US  06/04/2019  9:50 AM Armando Reichert, CNM [facility] None  07/02/2019  8:10 AM Nancy Cable, CNM [facility] None    Cherre Robins, CNM [facility] [employer], [facility]

## 2019-05-13 ENCOUNTER — Ambulatory Visit: Payer: BC Managed Care – PPO | Admitting: Diagnostic Neuroimaging

## 2019-05-13 ENCOUNTER — Other Ambulatory Visit: Payer: Self-pay

## 2019-05-13 ENCOUNTER — Encounter: Payer: Self-pay | Admitting: Diagnostic Neuroimaging

## 2019-05-13 VITALS — BP 118/76 | HR 107 | Temp 97.4°F | Ht 65.0 in | Wt 210.0 lb

## 2019-05-13 DIAGNOSIS — R519 Headache, unspecified: Secondary | ICD-10-CM | POA: Diagnosis not present

## 2019-05-13 DIAGNOSIS — G44209 Tension-type headache, unspecified, not intractable: Secondary | ICD-10-CM | POA: Diagnosis not present

## 2019-05-13 DIAGNOSIS — O26892 Other specified pregnancy related conditions, second trimester: Secondary | ICD-10-CM

## 2019-05-13 NOTE — Progress Notes (Signed)
[facility]  PATIENT: Nancy Hatfield DOB: 02-16-1991  REFERRING CLINICIAN: Gavin Pound HISTORY FROM: patient  REASON FOR VISIT: new consult    HISTORICAL  CHIEF COMPLAINT:  Chief Complaint  Patient presents with  . Headache in Pregnancy    rm 7 New Pt, [redacted] weeks pregnant "headaches in pregnancy, intermittent times of day, aches around my head like a sitting ache, no vision changes, no nausea"    HISTORY OF PRESENT ILLNESS:   28 year old female G2, P1, [redacted] weeks pregnant, here for evaluation of headaches.  Symptoms of headache started about 2 months ago and consist of frontal or unilateral pressure headaches sometimes throbbing, worse with standing up or moving quickly.  No nausea or vomiting with headaches.  No vision changes, loss of vision or scintillating scotoma.  No slurred speech, numbness or dizziness.  No prior similar headaches.  Headaches can occur 2 times per week.  She has tried Tylenol at times without relief.  Blood pressure has been normal at home and with office visits.  Stress levels are slightly increased as she is trying to complete her masters degree in [employer] currently.    REVIEW OF SYSTEMS: Full 14 system review of systems performed and negative with exception of: As per HPI.  ALLERGIES: No Known Allergies  HOME MEDICATIONS: Outpatient Medications Prior to Visit  Medication Sig Dispense Refill  . aspirin 81 MG chewable tablet Chew 1 tablet (81 mg total) by mouth daily. 60 tablet 1  . Prenatal Vit-Fe Phos-FA-Omega (VITAFOL GUMMIES) 3.33-0.333-34.8 MG CHEW Chew 3 each by mouth daily. 90 tablet 12   No facility-administered medications prior to visit.     PAST MEDICAL HISTORY: Past Medical History:  Diagnosis Date  . Medical history non-contributory     PAST SURGICAL HISTORY: Past Surgical History:  Procedure Laterality Date  . NO PAST SURGERIES      FAMILY HISTORY: Family History  Problem Relation Age of Onset  .  Prostate cancer Father   . Hypertension Mother     SOCIAL HISTORY: Social History   Socioeconomic History  . Marital status: Single    Spouse name: Jeneen Rinks   . Number of children: 0  . Years of education: last year of master's degree  . Highest education level: Bachelor's degree (e.g., BA, AB, BS)  Occupational History  . Not on file  Social Needs  . Financial resource strain: Patient refused  . Food insecurity    Worry: Patient refused    Inability: Patient refused  . Transportation needs    Medical: Patient refused    Non-medical: Patient refused  Tobacco Use  . Smoking status: Never Smoker  . Smokeless tobacco: Never Used  Substance and Sexual Activity  . Alcohol use: Not Currently  . Drug use: Not Currently    Types: Marijuana    Comment: 05/13/19 recently stopped  . Sexual activity: Yes    Birth control/protection: None  Lifestyle  . Physical activity    Days per week: Patient refused    Minutes per session: Patient refused  . Stress: Patient refused  Relationships  . Social Herbalist on phone: Patient refused    Gets together: Patient refused    Attends religious service: Patient refused    Active member of club or organization: Patient refused    Attends meetings of clubs or organizations: Patient refused    Relationship status: Patient refused  . Intimate partner violence    Fear of current or ex partner:  No    Emotionally abused: No    Physically abused: No    Forced sexual activity: No  Other Topics Concern  . Not on file  Social History Narrative   Caffeine- coffee maybe every other day, sodas 1 daily     PHYSICAL EXAM  GENERAL EXAM/CONSTITUTIONAL: Vitals:  Vitals:   05/13/19 1502  BP: 118/76  Pulse: (!) 107  Temp: (!) 97.4 F (36.3 C)  Weight: 210 lb (95.3 kg)  Height: 5\' 5"  (1.651 m)     Body mass index is 34.95 kg/m. Wt Readings from Last 3 Encounters:  05/13/19 210 lb (95.3 kg)  05/07/19 205 lb 3.2 oz (93.1 kg)   03/12/19 200 lb 8 oz (90.9 kg)     Patient is in no distress; well developed, nourished and groomed; neck is supple  GRAVID ABDOMEN  CARDIOVASCULAR:  Examination of carotid arteries is normal; no carotid bruits  Regular rate and rhythm, no murmurs  Examination of peripheral vascular system by observation and palpation is normal  EYES:  Ophthalmoscopic exam of optic discs and posterior segments is normal; no papilledema or hemorrhages  No exam data present  MUSCULOSKELETAL:  Gait, strength, tone, movements noted in Neurologic exam below  NEUROLOGIC: MENTAL STATUS:  No flowsheet data found.  awake, alert, oriented to person, place and time  recent and remote memory intact  normal attention and concentration  language fluent, comprehension intact, naming intact  fund of knowledge appropriate  CRANIAL NERVE:   2nd - no papilledema on fundoscopic exam  2nd, 3rd, 4th, 6th - pupils equal and reactive to light, visual fields full to confrontation, extraocular muscles intact, no nystagmus  5th - facial sensation symmetric  7th - facial strength symmetric  8th - hearing intact  9th - palate elevates symmetrically, uvula midline  11th - shoulder shrug symmetric  12th - tongue protrusion midline  MOTOR:   normal bulk and tone, full strength in the BUE, BLE  SENSORY:   normal and symmetric to light touch, temperature, vibration  COORDINATION:   finger-nose-finger, fine finger movements normal  REFLEXES:   deep tendon reflexes present and symmetric  GAIT/STATION:   narrow based gait     DIAGNOSTIC DATA (LABS, IMAGING, TESTING) - I reviewed patient records, labs, notes, testing and imaging myself where available.  Lab Results  Component Value Date   WBC 6.6 03/12/2019   HGB 12.0 03/12/2019   HCT 34.5 03/12/2019   MCV 89 03/12/2019   PLT 242 03/12/2019   No results found for: NA, K, CL, CO2, GLUCOSE, BUN, CREATININE, CALCIUM, PROT, ALBUMIN,  AST, ALT, ALKPHOS, BILITOT, GFRNONAA, GFRAA No results found for: CHOL, HDL, LDLCALC, LDLDIRECT, TRIG, CHOLHDL No results found for: ZOXW9UHGBA1C No results found for: VITAMINB12 No results found for: TSH     ASSESSMENT AND PLAN  28 y.o. year old female here with new onset headaches in September 2020 during pregnancy, persisting into second trimester.  Mainly tension headache features, with some migraine features.  Neurologic examination is unremarkable.  Recommend conservative management for now.  Dx:  1. Tension headache   2. Headache in pregnancy, antepartum, second trimester     PLAN:  HEADACHES IN PREGNANCY ([redacted] weeks EGA) --> tension HA + migraine HA - monitor BP at home; monitor for pre-eclampsia labs per ob/gyn - monitor symptoms; may consider MRI brain in 3rd trimester if significant increase or change in sxs - tylenol as needed for HA - To prevent or relieve headaches, try the following:  Cool Compress. Lie down and place a cool compress on your head.   Avoid headache triggers. If certain foods or odors seem to have triggered your migraines in the past, avoid them. A headache diary might help you identify triggers.   Include physical activity in your daily routine.   Manage stress. Find healthy ways to cope with the stressors, such as delegating tasks on your to-do list.   Practice relaxation techniques. Try deep breathing, yoga, massage and visualization.   Eat regularly. Eating regularly scheduled meals and maintaining a healthy diet might help prevent headaches. Also, drink plenty of fluids.   Follow a regular sleep schedule. Sleep deprivation might contribute to headaches  Consider biofeedback. With this mind-body technique, you learn to control certain bodily functions - such as muscle tension, heart rate and blood pressure - to prevent headaches or reduce headache pain.  Return for pending if symptoms worsen or fail to improve.    Suanne Marker, MD  05/13/2019, 3:21 PM Certified in Neurology, Neurophysiology and Neuroimaging  [facility] [address], [address] [place], [place] [address] [phone]

## 2019-05-28 ENCOUNTER — Other Ambulatory Visit (HOSPITAL_COMMUNITY): Payer: Self-pay | Admitting: *Deleted

## 2019-05-28 ENCOUNTER — Encounter (HOSPITAL_COMMUNITY): Payer: Self-pay

## 2019-05-28 ENCOUNTER — Ambulatory Visit (HOSPITAL_COMMUNITY): Payer: BC Managed Care – PPO | Admitting: *Deleted

## 2019-05-28 ENCOUNTER — Other Ambulatory Visit: Payer: Self-pay

## 2019-05-28 ENCOUNTER — Ambulatory Visit (HOSPITAL_COMMUNITY)
Admission: RE | Admit: 2019-05-28 | Discharge: 2019-05-28 | Disposition: A | Discharge status: Home / Self Care | Payer: BC Managed Care – PPO | Source: Ambulatory Visit | Attending: Obstetrics and Gynecology | Admitting: Obstetrics and Gynecology

## 2019-05-28 DIAGNOSIS — Z3A22 22 weeks gestation of pregnancy: Secondary | ICD-10-CM | POA: Diagnosis not present

## 2019-05-28 DIAGNOSIS — Z362 Encounter for other antenatal screening follow-up: Secondary | ICD-10-CM

## 2019-05-28 DIAGNOSIS — Z3400 Encounter for supervision of normal first pregnancy, unspecified trimester: Secondary | ICD-10-CM

## 2019-05-28 DIAGNOSIS — O99212 Obesity complicating pregnancy, second trimester: Secondary | ICD-10-CM

## 2019-06-04 ENCOUNTER — Other Ambulatory Visit: Payer: Self-pay

## 2019-06-04 ENCOUNTER — Encounter: Payer: Self-pay | Admitting: Advanced Practice Midwife

## 2019-06-04 ENCOUNTER — Telehealth (INDEPENDENT_AMBULATORY_CARE_PROVIDER_SITE_OTHER): Payer: BC Managed Care – PPO | Admitting: Advanced Practice Midwife

## 2019-06-04 DIAGNOSIS — Z3480 Encounter for supervision of other normal pregnancy, unspecified trimester: Secondary | ICD-10-CM

## 2019-06-04 DIAGNOSIS — Z3482 Encounter for supervision of other normal pregnancy, second trimester: Secondary | ICD-10-CM

## 2019-06-04 DIAGNOSIS — Z3A24 24 weeks gestation of pregnancy: Secondary | ICD-10-CM

## 2019-06-04 DIAGNOSIS — Z3400 Encounter for supervision of normal first pregnancy, unspecified trimester: Secondary | ICD-10-CM

## 2019-06-04 NOTE — Progress Notes (Signed)
   TELEHEALTH VIRTUAL OBSTETRICS VISIT ENCOUNTER NOTE  I connected with Nancy Hatfield on 06/04/19 at  9:50 AM EST by telephone at home and verified that I am speaking with the correct person using two identifiers.   I discussed the limitations, risks, security and privacy concerns of performing an evaluation and management service by telephone and the availability of in person appointments. I also discussed with the patient that there may be a patient responsible charge related to this service. The patient expressed understanding and agreed to proceed.  Subjective:  Nancy Hatfield is a 28 y.o. G2P0010 at [redacted]w[redacted]d being followed for ongoing prenatal care.  She is currently monitored for the following issues for this low-risk pregnancy and has Supervision of normal first pregnancy, antepartum; Headache in pregnancy, antepartum, second trimester; and Obesity in pregnancy on their problem list.  Patient reports no complaints. Reports fetal movement. Denies any contractions, bleeding or leaking of fluid.   The following portions of the patient's history were reviewed and updated as appropriate: allergies, current medications, past family history, past medical history, past social history, past surgical history and problem list.   Objective:   General:  Alert, oriented and cooperative.   Mental Status: Normal mood and affect perceived. Normal judgment and thought content.  Rest of physical exam deferred due to type of encounter  Assessment and Plan:  Pregnancy: G2P0010 at [redacted]w[redacted]d 1. Supervision of other normal pregnancy, antepartum - routine care - GTT at next visit  - Patient saw Neuro for headaches, plans to start taking Magnesium to see if that helps with headaches.     Preterm labor symptoms and general obstetric precautions including but not limited to vaginal bleeding, contractions, leaking of fluid and fetal movement were reviewed in detail with the patient.  I discussed the assessment and  treatment plan with the patient. The patient was provided an opportunity to ask questions and all were answered. The patient agreed with the plan and demonstrated an understanding of the instructions. The patient was advised to call back or seek an in-person office evaluation/go to MAU at [facility] for any urgent or concerning symptoms. Please refer to After Visit Summary for other counseling recommendations.   I provided 10 minutes of non-face-to-face time during this encounter.  Return in about 4 weeks (around 07/02/2019) for in person visit with 28 week labs and GTT .  Future Appointments  Date Time Provider Powers  06/23/2019  9:45 AM [place] [facility] WH-MFCUS MFC-US  06/23/2019  9:55 AM WH-MFC NURSE WH-MFC MFC-US  07/04/2019  8:10 AM Tresea Mall, CNM [facility] None     Marcille Buffy DNP, CNM  06/04/19  10:25 AM  [facility]

## 2019-06-05 ENCOUNTER — Telehealth: Payer: BC Managed Care – PPO | Admitting: Obstetrics and Gynecology

## 2019-06-20 NOTE — L&D Delivery Note (Addendum)
Delivery Note At 6:44 PM a viable female was delivered via Vaginal, Spontaneous (Presentation: Left Occiput Anterior).  APGAR: 5, 9; weight 3 lb 9.5 oz (1630 g).   Placenta status: Spontaneous, Intact.  Cord: 3 vessels with the following complications: None.  Cord pH: Pending  Anesthesia: None Episiotomy: None Lacerations: 1st degree Suture Repair: 3.0 vicryl Est. Blood Loss (mL):  150  Mom to postpartum.  Baby to NICU.  Jackelyn Poling 08/16/2019, 7:12 PM  Patient is a 29 y.o. at [redacted]w[redacted]d who was admitted for preeclampsia with severe features, uncomplicated prenatal course until diagnosis.  She progressed with augmentation via cytotec, FB, pitocin and AROM.  I was gloved and present for delivery in its entirety.  Second stage of labor progressed, baby delivered after 1 contraction.   Complications: nuchal x1  Lacerations: 1st degree perineal   EBL: 150  Rolm Bookbinder, CNM 7:27 PM

## 2019-06-23 ENCOUNTER — Ambulatory Visit (HOSPITAL_COMMUNITY): Payer: Medicaid Other | Admitting: *Deleted

## 2019-06-23 ENCOUNTER — Other Ambulatory Visit: Payer: Self-pay

## 2019-06-23 ENCOUNTER — Ambulatory Visit (HOSPITAL_COMMUNITY)
Admission: RE | Admit: 2019-06-23 | Discharge: 2019-06-23 | Disposition: A | Discharge status: Home / Self Care | Payer: Medicaid Other | Source: Ambulatory Visit | Attending: Obstetrics and Gynecology | Admitting: Obstetrics and Gynecology

## 2019-06-23 ENCOUNTER — Encounter (HOSPITAL_COMMUNITY): Payer: Self-pay | Admitting: *Deleted

## 2019-06-23 DIAGNOSIS — Z362 Encounter for other antenatal screening follow-up: Secondary | ICD-10-CM | POA: Insufficient documentation

## 2019-06-23 DIAGNOSIS — O99212 Obesity complicating pregnancy, second trimester: Secondary | ICD-10-CM

## 2019-06-23 DIAGNOSIS — Z3A25 25 weeks gestation of pregnancy: Secondary | ICD-10-CM

## 2019-06-23 DIAGNOSIS — Z3400 Encounter for supervision of normal first pregnancy, unspecified trimester: Secondary | ICD-10-CM | POA: Insufficient documentation

## 2019-06-26 ENCOUNTER — Encounter: Payer: Self-pay | Admitting: General Practice

## 2019-07-02 ENCOUNTER — Encounter: Payer: BC Managed Care – PPO | Admitting: Certified Nurse Midwife

## 2019-07-04 ENCOUNTER — Encounter: Payer: Self-pay | Admitting: Advanced Practice Midwife

## 2019-07-04 ENCOUNTER — Ambulatory Visit (INDEPENDENT_AMBULATORY_CARE_PROVIDER_SITE_OTHER): Payer: Self-pay | Admitting: Advanced Practice Midwife

## 2019-07-04 ENCOUNTER — Other Ambulatory Visit: Payer: Self-pay

## 2019-07-04 VITALS — BP 125/81 | HR 93 | Wt 222.6 lb

## 2019-07-04 DIAGNOSIS — Z3402 Encounter for supervision of normal first pregnancy, second trimester: Secondary | ICD-10-CM | POA: Diagnosis not present

## 2019-07-04 DIAGNOSIS — Z3A28 28 weeks gestation of pregnancy: Secondary | ICD-10-CM

## 2019-07-04 DIAGNOSIS — Z3400 Encounter for supervision of normal first pregnancy, unspecified trimester: Secondary | ICD-10-CM

## 2019-07-04 NOTE — Progress Notes (Signed)
Decline flu  

## 2019-07-04 NOTE — Patient Instructions (Addendum)
Doulas in [facility]  [email or web address]  [email or web address] [email or web address]  [email or web address]  [email or web address]  Doula application   [email or web address]

## 2019-07-04 NOTE — Progress Notes (Signed)
   PRENATAL VISIT NOTE  Subjective:  Nancy Hatfield is a 29 y.o. G2P0010 at 20w2dbeing seen today for ongoing prenatal care.  She is currently monitored for the following issues for this low-risk pregnancy and has Supervision of normal first pregnancy, antepartum; Headache in pregnancy, antepartum, second trimester; and Obesity in pregnancy on their problem list.  Patient reports no complaints.  Contractions: Not present.  .  Movement: Present. Denies leaking of fluid.   The following portions of the patient's history were reviewed and updated as appropriate: allergies, current medications, past family history, past medical history, past social history, past surgical history and problem list.   Objective:   Vitals:   07/04/19 0838  BP: 125/81  Pulse: 93  Weight: 222 lb 9.6 oz (101 kg)    Fetal Status: Fetal Heart Rate (bpm): 138 Fundal Height: 28 cm Movement: Present     General:  Alert, oriented and cooperative. Patient is in no acute distress.  Skin: Skin is warm and dry. No rash noted.   Cardiovascular: Normal heart rate noted  Respiratory: Normal respiratory effort, no problems with respiration noted  Abdomen: Soft, gravid, appropriate for gestational age.  Pain/Pressure: Absent     Pelvic: Cervical exam deferred        Extremities: Normal range of motion.  Edema: None  Mental Status: Normal mood and affect. Normal behavior. Normal judgment and thought content.   Assessment and Plan:  Pregnancy: G2P0010 at 267w2d. Encounter for supervision of normal first pregnancy in second trimester - 2 Hour GTT - RPR - CBC - HIV antibody (with reflex) - Comp Met (CMET)  Preterm labor symptoms and general obstetric precautions including but not limited to vaginal bleeding, contractions, leaking of fluid and fetal movement were reviewed in detail with the patient. Please refer to After Visit Summary for other counseling recommendations.   Return in about 2 weeks (around 07/18/2019) for  virtual visit .  Future Appointments  Date Time Provider De[place]2/03/2020 10:50 AM HoMarcille Buffy, CNM [facility] None  08/28/2019 10:10 AM DaLaury DeepCNM [facility] None    HeMarcille BuffyNP, CNM  07/04/19  9:32 AM

## 2019-07-05 LAB — GLUCOSE TOLERANCE, 2 HOURS W/ 1HR
Glucose, 1 hour: 166 mg/dL (ref 65–179)
Glucose, 2 hour: 122 mg/dL (ref 65–152)
Glucose, Fasting: 84 mg/dL (ref 65–91)

## 2019-07-05 LAB — COMPREHENSIVE METABOLIC PANEL
ALT: 12 IU/L (ref 0–32)
AST: 14 IU/L (ref 0–40)
Albumin/Globulin Ratio: 1.4 (ref 1.2–2.2)
Albumin: 3.6 g/dL — ABNORMAL LOW (ref 3.9–5.0)
Alkaline Phosphatase: 113 IU/L (ref 39–117)
BUN/Creatinine Ratio: 14 (ref 9–23)
BUN: 8 mg/dL (ref 6–20)
Bilirubin Total: <0.2 mg/dL (ref 0.0–1.2)
CO2: 19 mmol/L — ABNORMAL LOW (ref 20–29)
Calcium: 9.9 mg/dL (ref 8.7–10.2)
Chloride: 102 mmol/L (ref 96–106)
Creatinine, Ser: 0.57 mg/dL (ref 0.57–1.00)
GFR calc Af Amer: 145 mL/min/{1.73_m2} (ref >59)
GFR calc non Af Amer: 126 mL/min/{1.73_m2} (ref >59)
Globulin, Total: 2.6 g/dL (ref 1.5–4.5)
Glucose: 168 mg/dL — ABNORMAL HIGH (ref 65–99)
Potassium: 4.2 mmol/L (ref 3.5–5.2)
Sodium: 135 mmol/L (ref 134–144)
Total Protein: 6.2 g/dL (ref 6.0–8.5)

## 2019-07-05 LAB — CBC
Hematocrit: 31.6 % — ABNORMAL LOW (ref 34.0–46.6)
Hemoglobin: 10.5 g/dL — ABNORMAL LOW (ref 11.1–15.9)
MCH: 29.5 pg (ref 26.6–33.0)
MCHC: 33.2 g/dL (ref 31.5–35.7)
MCV: 89 fL (ref 79–97)
Platelets: 235 10*3/uL (ref 150–450)
RBC: 3.56 x10E6/uL — ABNORMAL LOW (ref 3.77–5.28)
RDW: 12.1 % (ref 11.7–15.4)
WBC: 7.6 10*3/uL (ref 3.4–10.8)

## 2019-07-05 LAB — HIV ANTIBODY (ROUTINE TESTING W REFLEX): HIV Screen 4th Generation wRfx: NONREACTIVE

## 2019-07-05 LAB — RPR: RPR Ser Ql: NONREACTIVE

## 2019-07-06 ENCOUNTER — Encounter: Payer: Self-pay | Admitting: Advanced Practice Midwife

## 2019-07-25 ENCOUNTER — Encounter: Payer: Self-pay | Admitting: General Practice

## 2019-07-30 ENCOUNTER — Other Ambulatory Visit: Payer: Self-pay

## 2019-07-30 ENCOUNTER — Telehealth (INDEPENDENT_AMBULATORY_CARE_PROVIDER_SITE_OTHER): Payer: Medicaid Other | Admitting: Advanced Practice Midwife

## 2019-07-30 ENCOUNTER — Encounter: Payer: Self-pay | Admitting: General Practice

## 2019-07-30 DIAGNOSIS — Z3A32 32 weeks gestation of pregnancy: Secondary | ICD-10-CM

## 2019-07-30 DIAGNOSIS — Z3403 Encounter for supervision of normal first pregnancy, third trimester: Secondary | ICD-10-CM

## 2019-07-30 DIAGNOSIS — Z3400 Encounter for supervision of normal first pregnancy, unspecified trimester: Secondary | ICD-10-CM

## 2019-07-30 NOTE — Progress Notes (Signed)
   TELEHEALTH VIRTUAL OBSTETRICS VISIT ENCOUNTER NOTE  I connected with Nancy Hatfield on 07/30/19 at 10:50 AM EST by telephone at home and verified that I am speaking with the correct person using two identifiers.   I discussed the limitations, risks, security and privacy concerns of performing an evaluation and management service by telephone and the availability of in person appointments. I also discussed with the patient that there may be a patient responsible charge related to this service. The patient expressed understanding and agreed to proceed.  Subjective:  Nancy Hatfield is a 29 y.o. G2P0010 at [redacted]w[redacted]d being followed for ongoing prenatal care.  She is currently monitored for the following issues for this low-risk pregnancy and has Supervision of normal first pregnancy, antepartum; Headache in pregnancy, antepartum, second trimester; and Obesity in pregnancy on their problem list.  Patient reports no complaints. Reports fetal movement. Denies any contractions, bleeding or leaking of fluid.   The following portions of the patient's history were reviewed and updated as appropriate: allergies, current medications, past family history, past medical history, past social history, past surgical history and problem list.   Objective:   General:  Alert, oriented and cooperative.   Mental Status: Normal mood and affect perceived. Normal judgment and thought content.  Rest of physical exam deferred due to type of encounter  Assessment and Plan:  Pregnancy: G2P0010 at [redacted]w[redacted]d 1. Supervision of normal first pregnancy, antepartum - BP: 144/105 and then 140/100 on repeat. Denies any headache, visual disturbances or RUQ pain. Reports normal fetal movement. Denies any contractions. Had some swelling in her hands yesterday after a long walk.  - Will have patient come to the office this afternoon for BP check and to check home blood pressure cuff.   4:33 PM patient in the office. BP 123/81 here.  Patient was using cuff incorrectly at home. She was shown how to properly use cuff and BP was normal with her home cuff used properly. Will have her schedule for 2 week return OB visit.    Preterm labor symptoms and general obstetric precautions including but not limited to vaginal bleeding, contractions, leaking of fluid and fetal movement were reviewed in detail with the patient.  I discussed the assessment and treatment plan with the patient. The patient was provided an opportunity to ask questions and all were answered. The patient agreed with the plan and demonstrated an understanding of the instructions. The patient was advised to call back or seek an in-person office evaluation/go to MAU at [facility] for any urgent or concerning symptoms. Please refer to After Visit Summary for other counseling recommendations.   I provided 12 minutes of non-face-to-face time during this encounter.  No follow-ups on file.  Future Appointments  Date Time Provider Department Center  08/28/2019 10:10 AM Raelyn Mora, CNM [facility] None    Thressa Sheller DNP, CNM  07/30/19  11:39 AM  [facility] for [employer], [facility]

## 2019-08-05 ENCOUNTER — Telehealth: Payer: Self-pay | Admitting: *Deleted

## 2019-08-05 NOTE — Telephone Encounter (Signed)
Patient called reporting having pain from thigh down to knee. Patient denies any pain in calves. Patient is reported as sharp at times. Advised patient to try taking Tylenol, soaking in warm bath with epsom salt and doing stretches. If those interventions does not work to call clinic to have in office visit with provider. Next appointment with Nancy Hatfield, CNM on 07/15/19.  Clovis Pu, RN

## 2019-08-15 ENCOUNTER — Telehealth (INDEPENDENT_AMBULATORY_CARE_PROVIDER_SITE_OTHER): Payer: Medicaid Other | Admitting: Advanced Practice Midwife

## 2019-08-15 ENCOUNTER — Encounter (HOSPITAL_COMMUNITY): Payer: Self-pay | Admitting: Obstetrics and Gynecology

## 2019-08-15 ENCOUNTER — Encounter: Payer: Self-pay | Admitting: Advanced Practice Midwife

## 2019-08-15 ENCOUNTER — Inpatient Hospital Stay (HOSPITAL_COMMUNITY)
Admission: AD | Admit: 2019-08-15 | Discharge: 2019-08-18 | DRG: 807 | Disposition: A | Discharge status: Home / Self Care | Payer: Medicaid Other | Attending: Obstetrics & Gynecology | Admitting: Obstetrics & Gynecology

## 2019-08-15 ENCOUNTER — Other Ambulatory Visit: Payer: Self-pay

## 2019-08-15 DIAGNOSIS — Z3A34 34 weeks gestation of pregnancy: Secondary | ICD-10-CM | POA: Diagnosis not present

## 2019-08-15 DIAGNOSIS — O1413 Severe pre-eclampsia, third trimester: Secondary | ICD-10-CM | POA: Diagnosis not present

## 2019-08-15 DIAGNOSIS — O99213 Obesity complicating pregnancy, third trimester: Secondary | ICD-10-CM

## 2019-08-15 DIAGNOSIS — O1414 Severe pre-eclampsia complicating childbirth: Principal | ICD-10-CM | POA: Diagnosis present

## 2019-08-15 DIAGNOSIS — O99210 Obesity complicating pregnancy, unspecified trimester: Secondary | ICD-10-CM

## 2019-08-15 DIAGNOSIS — E669 Obesity, unspecified: Secondary | ICD-10-CM | POA: Diagnosis present

## 2019-08-15 DIAGNOSIS — O99214 Obesity complicating childbirth: Secondary | ICD-10-CM | POA: Diagnosis present

## 2019-08-15 DIAGNOSIS — Z20822 Contact with and (suspected) exposure to covid-19: Secondary | ICD-10-CM | POA: Diagnosis present

## 2019-08-15 DIAGNOSIS — Z3400 Encounter for supervision of normal first pregnancy, unspecified trimester: Secondary | ICD-10-CM

## 2019-08-15 DIAGNOSIS — O9081 Anemia of the puerperium: Secondary | ICD-10-CM | POA: Diagnosis not present

## 2019-08-15 LAB — COMPREHENSIVE METABOLIC PANEL
ALT: 24 U/L (ref 0–44)
AST: 41 U/L (ref 15–41)
Albumin: 2.4 g/dL — ABNORMAL LOW (ref 3.5–5.0)
Alkaline Phosphatase: 161 U/L — ABNORMAL HIGH (ref 38–126)
Anion gap: 10 (ref 5–15)
BUN: 10 mg/dL (ref 6–20)
CO2: 19 mmol/L — ABNORMAL LOW (ref 22–32)
Calcium: 8.9 mg/dL (ref 8.9–10.3)
Chloride: 107 mmol/L (ref 98–111)
Creatinine, Ser: 0.65 mg/dL (ref 0.44–1.00)
GFR calc Af Amer: >60 mL/min (ref >60)
GFR calc non Af Amer: >60 mL/min (ref >60)
Glucose, Bld: 110 mg/dL — ABNORMAL HIGH (ref 70–99)
Potassium: 4.5 mmol/L (ref 3.5–5.1)
Sodium: 136 mmol/L (ref 135–145)
Total Bilirubin: 0.4 mg/dL (ref 0.3–1.2)
Total Protein: 5.5 g/dL — ABNORMAL LOW (ref 6.5–8.1)

## 2019-08-15 LAB — CBC
HCT: 28.9 % — ABNORMAL LOW (ref 36.0–46.0)
Hemoglobin: 9.4 g/dL — ABNORMAL LOW (ref 12.0–15.0)
MCH: 27.7 pg (ref 26.0–34.0)
MCHC: 32.5 g/dL (ref 30.0–36.0)
MCV: 85.3 fL (ref 80.0–100.0)
Platelets: 204 10*3/uL (ref 150–400)
RBC: 3.39 MIL/uL — ABNORMAL LOW (ref 3.87–5.11)
RDW: 13.6 % (ref 11.5–15.5)
WBC: 5.8 10*3/uL (ref 4.0–10.5)
nRBC: 0 % (ref 0.0–0.2)

## 2019-08-15 LAB — SARS CORONAVIRUS 2 (TAT 6-24 HRS): SARS Coronavirus 2: NEGATIVE

## 2019-08-15 LAB — ABO/RH: ABO/RH(D): A POS

## 2019-08-15 LAB — TYPE AND SCREEN
ABO/RH(D): A POS
Antibody Screen: NEGATIVE

## 2019-08-15 LAB — PROTEIN / CREATININE RATIO, URINE
Creatinine, Urine: 39.87 mg/dL
Protein Creatinine Ratio: 8.65 mg/mg{Cre} — ABNORMAL HIGH (ref 0.00–0.15)
Total Protein, Urine: 345 mg/dL

## 2019-08-15 LAB — OB RESULTS CONSOLE GBS: GBS: NEGATIVE

## 2019-08-15 LAB — GROUP B STREP BY PCR: Group B strep by PCR: NEGATIVE

## 2019-08-15 MED ORDER — BETAMETHASONE SOD PHOS & ACET 6 (3-3) MG/ML IJ SUSP
12.0000 mg | Freq: Once | INTRAMUSCULAR | Status: AC
  Administered 2019-08-16: 12 mg via INTRAMUSCULAR

## 2019-08-15 MED ORDER — BETAMETHASONE SOD PHOS & ACET 6 (3-3) MG/ML IJ SUSP
12.0000 mg | Freq: Once | INTRAMUSCULAR | Status: AC
  Administered 2019-08-15: 12 mg via INTRAMUSCULAR
  Filled 2019-08-15: qty 5

## 2019-08-15 MED ORDER — MAGNESIUM SULFATE 40 GM/1000ML IV SOLN
2.0000 g/h | INTRAVENOUS | Status: AC
  Administered 2019-08-15 – 2019-08-17 (×3): 2 g/h via INTRAVENOUS
  Filled 2019-08-15 (×2): qty 1000

## 2019-08-15 MED ORDER — OXYTOCIN 40 UNITS IN NORMAL SALINE INFUSION - SIMPLE MED
2.5000 [IU]/h | INTRAVENOUS | Status: DC
  Administered 2019-08-16: 2.5 [IU]/h via INTRAVENOUS
  Filled 2019-08-15: qty 1000

## 2019-08-15 MED ORDER — FENTANYL CITRATE (PF) 100 MCG/2ML IJ SOLN: 50.0000 ug | INTRAMUSCULAR | Status: DC | PRN

## 2019-08-15 MED ORDER — LACTATED RINGERS IV SOLN: INTRAVENOUS | Status: DC

## 2019-08-15 MED ORDER — LACTATED RINGERS IV SOLN: 500.0000 mL | INTRAVENOUS | Status: DC | PRN

## 2019-08-15 MED ORDER — LABETALOL HCL 5 MG/ML IV SOLN: 20.0000 mg | INTRAVENOUS | Status: DC | PRN

## 2019-08-15 MED ORDER — MAGNESIUM SULFATE 40 GM/1000ML IV SOLN
INTRAVENOUS | Status: AC
  Administered 2019-08-15: 4 g via INTRAVENOUS
  Filled 2019-08-15: qty 1000

## 2019-08-15 MED ORDER — SOD CITRATE-CITRIC ACID 500-334 MG/5ML PO SOLN: 30.0000 mL | ORAL | Status: DC | PRN

## 2019-08-15 MED ORDER — SODIUM CHLORIDE 0.9 % IV SOLN
5.0000 10*6.[IU] | Freq: Once | INTRAVENOUS | Status: AC
  Administered 2019-08-15: 5 10*6.[IU] via INTRAVENOUS
  Filled 2019-08-15: qty 5

## 2019-08-15 MED ORDER — ACETAMINOPHEN 325 MG PO TABS
650.0000 mg | ORAL_TABLET | ORAL | Status: DC | PRN
  Administered 2019-08-15: 650 mg via ORAL
  Filled 2019-08-15: qty 2

## 2019-08-15 MED ORDER — LABETALOL HCL 5 MG/ML IV SOLN
40.0000 mg | INTRAVENOUS | Status: DC | PRN
  Administered 2019-08-15: 40 mg via INTRAVENOUS
  Filled 2019-08-15: qty 8

## 2019-08-15 MED ORDER — LABETALOL HCL 5 MG/ML IV SOLN
INTRAVENOUS | Status: AC
  Filled 2019-08-15: qty 4

## 2019-08-15 MED ORDER — HYDRALAZINE HCL 20 MG/ML IJ SOLN: 10.0000 mg | INTRAMUSCULAR | Status: DC | PRN

## 2019-08-15 MED ORDER — PENICILLIN G POT IN DEXTROSE 60000 UNIT/ML IV SOLN
3.0000 10*6.[IU] | INTRAVENOUS | Status: DC
  Administered 2019-08-15 – 2019-08-16 (×6): 3 10*6.[IU] via INTRAVENOUS
  Filled 2019-08-15 (×6): qty 50

## 2019-08-15 MED ORDER — ONDANSETRON HCL 4 MG/2ML IJ SOLN: 4.0000 mg | Freq: Four times a day (QID) | INTRAMUSCULAR | Status: DC | PRN

## 2019-08-15 MED ORDER — MISOPROSTOL 50MCG HALF TABLET
50.0000 ug | ORAL_TABLET | ORAL | Status: DC
  Administered 2019-08-15 – 2019-08-16 (×5): 50 ug via ORAL
  Filled 2019-08-15 (×5): qty 1

## 2019-08-15 MED ORDER — LIDOCAINE HCL (PF) 1 % IJ SOLN
30.0000 mL | INTRAMUSCULAR | Status: AC | PRN
  Administered 2019-08-16: 30 mL via SUBCUTANEOUS
  Filled 2019-08-15: qty 30

## 2019-08-15 MED ORDER — TERBUTALINE SULFATE 1 MG/ML IJ SOLN: 0.2500 mg | Freq: Once | INTRAMUSCULAR | Status: DC | PRN

## 2019-08-15 MED ORDER — OXYTOCIN BOLUS FROM INFUSION
500.0000 mL | Freq: Once | INTRAVENOUS | Status: AC
  Administered 2019-08-16: 500 mL via INTRAVENOUS

## 2019-08-15 MED ORDER — MAGNESIUM SULFATE BOLUS VIA INFUSION
4.0000 g | Freq: Once | INTRAVENOUS | Status: AC
  Filled 2019-08-15: qty 1000

## 2019-08-15 MED ORDER — LABETALOL HCL 5 MG/ML IV SOLN: 80.0000 mg | INTRAVENOUS | Status: DC | PRN

## 2019-08-15 NOTE — Progress Notes (Signed)
LABOR PROGRESS NOTE  Nancy Hatfield is a 29 y.o. G2P0010 at [redacted]w[redacted]d admitted for IOL PreE w SF.  Subjective: No headache or vision changes currently comfortable  Objective: BP (!) 145/98   Pulse 75   Temp 98.9 F (37.2 C) (Oral)   Resp 16   Ht 5\' 5"  (1.651 m)   Wt 110.6 kg   LMP 12/18/2018 (Approximate)   SpO2 99%   BMI 40.59 kg/m  or  Vitals:   08/15/19 1225 08/15/19 1230 08/15/19 1302 08/15/19 1317  BP: (!) 141/101 (!) 148/105 (!) 161/106 (!) 145/98  Pulse: 83 83 78 75  Resp:   16   Temp:   98.9 F (37.2 C)   TempSrc:   Oral   SpO2: 97% 99%    Weight:   110.6 kg   Height:   5\' 5"  (1.651 m)        FHT: baseline rate 135, moderate varibility, -acel, -decel Toco: no ctx  Labs: Lab Results  Component Value Date   WBC 5.8 08/15/2019   HGB 9.4 (L) 08/15/2019   HCT 28.9 (L) 08/15/2019   MCV 85.3 08/15/2019   PLT 204 08/15/2019    Patient Active Problem List   Diagnosis Date Noted  . Preeclampsia, severe, third trimester 08/15/2019  . Obesity in pregnancy 05/07/2019  . Headache in pregnancy, antepartum, second trimester 04/09/2019  . Supervision of normal first pregnancy, antepartum 01/28/2019    Assessment / Plan: 29 y.o. G2P0010 at [redacted]w[redacted]d here for IOL for PreE w SF by BP.  Labor: closed on exam, presentation cephalic confirmed on BSUS, given miso x1 at 1342 Fetal Wellbeing:  Cat I Pain Control:  IV pain meds PRN, would like to go without epidural GBS: unknown, per lab no culture media to run GBS, on empiric penicillin prophylaxis Anticipated MOD:  SVD  PreE w SF: multiple severe range BP's, s/p Labetalol IV 20 and 40 mg. Labs notable for UPCR of 8.65, other PreE labs wnl. On Mg gtt, has received 1st dose of betamethasone @1128  and ordered for second.   Anemia: hgb 9.4 on admission, consider txa at delivery pending labor course and time on magnesium and pit  37, MD/MP[facility] OB Fellow  08/15/2019, 1:47 PM

## 2019-08-15 NOTE — Progress Notes (Signed)
   TELEHEALTH VIRTUAL OBSTETRICS VISIT ENCOUNTER NOTE  I connected with Nancy Hatfield on 08/15/19 at  9:10 AM EST by telephone at home and verified that I am speaking with the correct person using two identifiers.   I discussed the limitations, risks, security and privacy concerns of performing an evaluation and management service by telephone and the availability of in person appointments. I also discussed with the patient that there may be a patient responsible charge related to this service. The patient expressed understanding and agreed to proceed.  Subjective:  Nancy Hatfield is a 29 y.o. G2P0010 at [redacted]w[redacted]d being followed for ongoing prenatal care.  She is currently monitored for the following issues for this low-risk pregnancy and has Supervision of normal first pregnancy, antepartum; Headache in pregnancy, antepartum, second trimester; and Obesity in pregnancy on their problem list.  Patient reports no complaints. Reports fetal movement. Denies any contractions, bleeding or leaking of fluid.   The following portions of the patient's history were reviewed and updated as appropriate: allergies, current medications, past family history, past medical history, past social history, past surgical history and problem list.   Objective:   General:  Alert, oriented and cooperative.   Mental Status: Normal mood and affect perceived. Normal judgment and thought content.  Rest of physical exam deferred due to type of encounter  Assessment and Plan:  Pregnancy: G2P0010 at [redacted]w[redacted]d 1. Obesity in pregnancy  2. Supervision of normal first pregnancy, antepartum - routine care - GBS at next visit  - BP was elevated last visit, but when patient came to the office she was not using BP cuff correctly. She was shown how to use cuff properly. Today BP is elevated again. Severe range this time. Patient states that she also had a 160/??? Last night when she checked it. She denies any HA, visual disturbance or  RUQ pain. Patient states that she is using cuff properly as shown during her last visit. Will send to MAU. MAU provider updated   Preterm labor symptoms and general obstetric precautions including but not limited to vaginal bleeding, contractions, leaking of fluid and fetal movement were reviewed in detail with the patient.  I discussed the assessment and treatment plan with the patient. The patient was provided an opportunity to ask questions and all were answered. The patient agreed with the plan and demonstrated an understanding of the instructions. The patient was advised to call back or seek an in-person office evaluation/go to MAU at [facility] for any urgent or concerning symptoms. Please refer to After Visit Summary for other counseling recommendations.   I provided 12 minutes of non-face-to-face time during this encounter.  Return in about 2 weeks (around 08/29/2019) for In person visit for 36 week labs .  Future Appointments  Date Time Provider Department Center  08/28/2019 10:10 AM Raelyn Mora, CNM [facility] None    Thressa Sheller DNP, CNM  08/15/19  9:14 AM  [facility] for [employer], [facility]

## 2019-08-15 NOTE — MAU Note (Signed)
Sent from office with BP elevation, denies hx of hypertension.  Denies HA, visual changes, epigastric pain or increase in swelling.

## 2019-08-15 NOTE — H&P (Signed)
LABOR AND DELIVERY ADMISSION HISTORY AND PHYSICAL NOTE  Nancy Hatfield is a 29 y.o. female G2P0010 with IUP at [redacted]w[redacted]d by LMP c/w 18wk [place] being admitted for pre-eclampsia with severe features.   Patient had telehealth visit earlier today where home BP cuff was reading 177/118 Provider note mentions that patient had prior elevated BP last night as well above 160 Also notes that on prior visit had been noted to be using BP cuff incorrectly On today's ROS in clinic denied PreE symptoms Last elevated BP was on 07/30/2019  144/105 on home use, came to office later that same day was 123/81 and educated on proper use of BP cuff  Patient reports SBP of 177>168>171 over the course of yesterday afternoon This morning checked it again and was 177/112 Currently denies headache, vision changes, chest pain, SOB, RUQ pain Endorses swelling in legs and feet  She currently denies any signs of labor including vaginal bleeding, loss of fluid, or contractions Endorses good fetal movement  She plans on breast feeding. Her contraception plan is: outpatient IUD.  Prenatal History/Complications: PNC at [facility]:  @[redacted]w[redacted]d , CWD, normal anatomy, breech presentation, fundal placenta, 21%ile, EFW 341  Pregnancy complications:  - Headaches, evaluated by Neurology and c/w tension and migraine  Past Medical History: Past Medical History:  Diagnosis Date  . Medical history non-contributory     Past Surgical History: Past Surgical History:  Procedure Laterality Date  . NO PAST SURGERIES      Obstetrical History: OB History    Gravida  2   Para      Term      Preterm      AB  1   Living        SAB      TAB      Ectopic      Multiple      Live Births              Social History: Social History   Socioeconomic History  . Marital status: Single    Spouse name: Jeneen Rinks   . Number of children: 0  . Years of education: last year of master's degree  . Highest education level:  Bachelor's degree (e.g., BA, AB, BS)  Occupational History  . Not on file  Tobacco Use  . Smoking status: Never Smoker  . Smokeless tobacco: Never Used  Substance and Sexual Activity  . Alcohol use: Not Currently  . Drug use: Not Currently    Types: Marijuana    Comment: 05/13/19 recently stopped  . Sexual activity: Yes    Birth control/protection: None  Other Topics Concern  . Not on file  Social History Narrative   Caffeine- coffee maybe every other day, sodas 1 daily   Social Determinants of Health   Financial Resource Strain: Unknown  . Difficulty of Paying Living Expenses: Patient refused  Food Insecurity: Unknown  . Worried About Charity fundraiser in the Last Year: Patient refused  . Ran Out of Food in the Last Year: Patient refused  Transportation Needs: Unknown  . Lack of Transportation (Medical): Patient refused  . Lack of Transportation (Non-Medical): Patient refused  Physical Activity: Unknown  . Days of Exercise per Week: Patient refused  . Minutes of Exercise per Session: Patient refused  Stress: Unknown  . Feeling of Stress : Patient refused  Social Connections: Unknown  . Frequency of Communication with Friends and Family: Patient refused  . Frequency of Social Gatherings with Friends and Family:  Patient refused  . Attends Religious Services: Patient refused  . Active Member of Clubs or Organizations: Patient refused  . Attends Banker Meetings: Patient refused  . Marital Status: Patient refused    Family History: Family History  Problem Relation Age of Onset  . Prostate cancer Father   . Hypertension Mother     Allergies: No Known Allergies  Medications Prior to Admission  Medication Sig Dispense Refill Last Dose  . aspirin 81 MG chewable tablet Chew 1 tablet (81 mg total) by mouth daily. 60 tablet 1 08/15/2019 at Unknown time  . Prenatal Vit-Fe Phos-FA-Omega (VITAFOL GUMMIES) 3.33-0.333-34.8 MG CHEW Chew 3 each by mouth daily. 90  tablet 12 08/15/2019 at Unknown time     Review of Systems  All systems reviewed and negative except as stated in HPI  Physical Exam Blood pressure (!) 163/103, pulse 80, temperature 98.4 F (36.9 C), temperature source Oral, resp. rate 18, height 5\' 5"  (1.651 m), weight 110.6 kg, last menstrual period 12/18/2018, SpO2 99 %. General appearance: alert, oriented, NAD Lungs: normal respiratory effort Heart: regular rate Abdomen: soft, non-tender; gravid Extremities: No calf swelling or tenderness  Fetal monitoringBaseline: 135 bpm, Variability: Good {> 6 bpm), Accelerations: Reactive and Decelerations: Absent Uterine activity: None     Prenatal labs: ABO, Rh: A/Positive/-- (09/23 1550) Antibody: Negative (09/23 1550) Rubella: 4.26 (09/23 1550) RPR: Non Reactive (01/15 0951)  HBsAg: Negative (09/23 1550)  HIV: Non Reactive (01/15 0951)  GC/Chlamydia: neg/neg 03/12/2019, repeat ordered GBS:   unknown 2-hr GTT: normal 07/04/2019 07/06/2019) Genetic screening:  Low risk Panorama Anatomy ([phone]: normal  Prenatal Transfer Tool  Maternal Diabetes: No Genetic Screening: Normal Maternal Ultrasounds/Referrals: Normal Fetal Ultrasounds or other Referrals:  None Maternal Substance Abuse:  No Significant Maternal Medications:  None Significant Maternal Lab Results: None  No results found for this or any previous visit (from the past 24 hour(s)).  Patient Active Problem List   Diagnosis Date Noted  . Obesity in pregnancy 05/07/2019  . Headache in pregnancy, antepartum, second trimester 04/09/2019  . Supervision of normal first pregnancy, antepartum 01/28/2019    Assessment: Nancy Hatfield is a 29 y.o. G2P0010 at [redacted]w[redacted]d here for IOL due to pre-eclampsia with severe features.   #Labor: per SVE, likely misoprostol +/- FB if feasible #Pain: IV pain meds PRN, epidural upon request #FWB: Cat I #GBS/ID: unknown, culture pending, ordered for penicillin given preterm #COVID: swab  pending #MOF: Breast #MOC: outpatient IUD #Circ: Yes, inpatient (BCBS)  #Pre-eclampsia with severe features: PreE w SF by BP's, currently asymptomatic, labs pending. Has received one dose of IV labetalol 20mg  so far, ctm and treat per protocol. Ordered for Magnesium bolus/gtt, also given first dose of betamethasone.  #Headaches: seen by Neurology in 04/2019, c/w tension and migraine headaches, no headache currently  08/15/2019, 11:49 AM

## 2019-08-15 NOTE — Progress Notes (Signed)
LABOR PROGRESS NOTE  Jowana Thumma is a 29 y.o. G2P0010 at 81w2dadmitted for IOL PreE w SF.  Subjective: Met patient and discussed plan. Overall comfortable; slight headache.   Objective: BP (!) 158/102   Pulse 81   Temp 98.2 F (36.8 C) (Oral)   Resp 16   Ht 5' 5"  (1.651 m)   Wt 110.6 kg   LMP 12/18/2018 (Approximate)   SpO2 99%   BMI 40.59 kg/m  or  Vitals:   08/15/19 1801 08/15/19 1901 08/15/19 1936 08/15/19 2004  BP: (!) 147/100 (!) 136/93  (!) 158/102  Pulse: 84 84  81  Resp: 16 16  16   Temp:   98.2 F (36.8 C)   TempSrc:   Oral   SpO2:      Weight:      Height:         Dilation: Closed Effacement (%): Thick Presentation: Vertex Exam by:: Dr. FMarice PotterFHT: baseline rate 140, moderate varibility, -acel, -decel Toco: none  Labs: Lab Results  Component Value Date   WBC 5.8 08/15/2019   HGB 9.4 (L) 08/15/2019   HCT 28.9 (L) 08/15/2019   MCV 85.3 08/15/2019   PLT 204 08/15/2019    Patient Active Problem List   Diagnosis Date Noted  . Preeclampsia, severe, third trimester 08/15/2019  . Obesity in pregnancy 05/07/2019  . Headache in pregnancy, antepartum, second trimester 04/09/2019  . Supervision of normal first pregnancy, antepartum 01/28/2019    Assessment / Plan: 29y.o. G2P0010 at 334w2dere for IOL for PreE w SF by BP.  Labor: Vertex by BSUS on admission. Visually closed by speculum exam. Foley bulb placed with speculum and 60 mL water inserted; tolerated well. Cont Cytotec while FB in place. Anticipate eventual SVD. Fetal Wellbeing:  Cat I Pain Control:  IV pain meds PRN, would like to go without epidural GBS: unknown, per lab no culture media to run GBS, on empiric penicillin prophylaxis Anticipated MOD:  SVD  PreE w SF: multiple severe range BP's, s/p Labetalol IV 20 and 40 mg. Labs notable for UPCR of 8.65, other PreE labs wnl. On Mg gtt, has received 1st dose of betamethasone @1128  and ordered for second.   Anemia: hgb 9.4 on admission,  consider txa at delivery pending labor course and time on magnesium and pit  ChBarrington EllisonMD [facility]amily Medicine Fellow, [facility]Patient [facility] 08/15/2019, 9:01 PM

## 2019-08-16 ENCOUNTER — Encounter (HOSPITAL_COMMUNITY): Payer: Self-pay | Admitting: Obstetrics & Gynecology

## 2019-08-16 DIAGNOSIS — O1413 Severe pre-eclampsia, third trimester: Secondary | ICD-10-CM

## 2019-08-16 DIAGNOSIS — Z3A34 34 weeks gestation of pregnancy: Secondary | ICD-10-CM

## 2019-08-16 LAB — COMPREHENSIVE METABOLIC PANEL
ALT: 19 U/L (ref 0–44)
AST: 21 U/L (ref 15–41)
Albumin: 2.4 g/dL — ABNORMAL LOW (ref 3.5–5.0)
Alkaline Phosphatase: 160 U/L — ABNORMAL HIGH (ref 38–126)
Anion gap: 10 (ref 5–15)
BUN: 10 mg/dL (ref 6–20)
CO2: 20 mmol/L — ABNORMAL LOW (ref 22–32)
Calcium: 8.1 mg/dL — ABNORMAL LOW (ref 8.9–10.3)
Chloride: 101 mmol/L (ref 98–111)
Creatinine, Ser: 0.8 mg/dL (ref 0.44–1.00)
GFR calc Af Amer: >60 mL/min (ref >60)
GFR calc non Af Amer: >60 mL/min (ref >60)
Glucose, Bld: 107 mg/dL — ABNORMAL HIGH (ref 70–99)
Potassium: 4.5 mmol/L (ref 3.5–5.1)
Sodium: 131 mmol/L — ABNORMAL LOW (ref 135–145)
Total Bilirubin: 0.4 mg/dL (ref 0.3–1.2)
Total Protein: 5.6 g/dL — ABNORMAL LOW (ref 6.5–8.1)

## 2019-08-16 LAB — CBC
HCT: 29.1 % — ABNORMAL LOW (ref 36.0–46.0)
Hemoglobin: 9.5 g/dL — ABNORMAL LOW (ref 12.0–15.0)
MCH: 27.3 pg (ref 26.0–34.0)
MCHC: 32.6 g/dL (ref 30.0–36.0)
MCV: 83.6 fL (ref 80.0–100.0)
Platelets: 252 10*3/uL (ref 150–400)
RBC: 3.48 MIL/uL — ABNORMAL LOW (ref 3.87–5.11)
RDW: 13.9 % (ref 11.5–15.5)
WBC: 12.9 10*3/uL — ABNORMAL HIGH (ref 4.0–10.5)
nRBC: 0 % (ref 0.0–0.2)

## 2019-08-16 LAB — RPR: RPR Ser Ql: NONREACTIVE

## 2019-08-16 MED ORDER — HYDRALAZINE HCL 20 MG/ML IJ SOLN: 10.0000 mg | INTRAMUSCULAR | Status: DC | PRN

## 2019-08-16 MED ORDER — LABETALOL HCL 5 MG/ML IV SOLN
INTRAVENOUS | Status: AC
  Filled 2019-08-16: qty 4

## 2019-08-16 MED ORDER — DIBUCAINE (PERIANAL) 1 % EX OINT: 1.0000 "application " | TOPICAL_OINTMENT | CUTANEOUS | Status: DC | PRN

## 2019-08-16 MED ORDER — COCONUT OIL OIL: 1.0000 "application " | TOPICAL_OIL | Status: DC | PRN

## 2019-08-16 MED ORDER — LABETALOL HCL 5 MG/ML IV SOLN
20.0000 mg | INTRAVENOUS | Status: DC | PRN
  Administered 2019-08-16: 20 mg via INTRAVENOUS

## 2019-08-16 MED ORDER — ONDANSETRON HCL 4 MG PO TABS: 4.0000 mg | ORAL_TABLET | ORAL | Status: DC | PRN

## 2019-08-16 MED ORDER — WITCH HAZEL-GLYCERIN EX PADS: 1.0000 "application " | MEDICATED_PAD | CUTANEOUS | Status: DC | PRN

## 2019-08-16 MED ORDER — LABETALOL HCL 5 MG/ML IV SOLN: 40.0000 mg | INTRAVENOUS | Status: DC | PRN

## 2019-08-16 MED ORDER — IBUPROFEN 600 MG PO TABS
600.0000 mg | ORAL_TABLET | Freq: Four times a day (QID) | ORAL | Status: DC
  Administered 2019-08-16 – 2019-08-18 (×7): 600 mg via ORAL
  Filled 2019-08-16 (×7): qty 1

## 2019-08-16 MED ORDER — TETANUS-DIPHTH-ACELL PERTUSSIS 5-2.5-18.5 LF-MCG/0.5 IM SUSP: 0.5000 mL | Freq: Once | INTRAMUSCULAR | Status: DC

## 2019-08-16 MED ORDER — LABETALOL HCL 5 MG/ML IV SOLN: 80.0000 mg | INTRAVENOUS | Status: DC | PRN

## 2019-08-16 MED ORDER — DIPHENHYDRAMINE HCL 25 MG PO CAPS: 25.0000 mg | ORAL_CAPSULE | Freq: Four times a day (QID) | ORAL | Status: DC | PRN

## 2019-08-16 MED ORDER — LACTATED RINGERS IV SOLN: INTRAVENOUS | Status: DC

## 2019-08-16 MED ORDER — ACETAMINOPHEN 325 MG PO TABS: 650.0000 mg | ORAL_TABLET | ORAL | Status: DC | PRN

## 2019-08-16 MED ORDER — ONDANSETRON HCL 4 MG/2ML IJ SOLN: 4.0000 mg | INTRAMUSCULAR | Status: DC | PRN

## 2019-08-16 MED ORDER — SIMETHICONE 80 MG PO CHEW: 80.0000 mg | CHEWABLE_TABLET | ORAL | Status: DC | PRN

## 2019-08-16 MED ORDER — TERBUTALINE SULFATE 1 MG/ML IJ SOLN: 0.2500 mg | Freq: Once | INTRAMUSCULAR | Status: DC | PRN

## 2019-08-16 MED ORDER — BENZOCAINE-MENTHOL 20-0.5 % EX AERO: 1.0000 "application " | INHALATION_SPRAY | CUTANEOUS | Status: DC | PRN

## 2019-08-16 MED ORDER — PRENATAL MULTIVITAMIN CH
1.0000 | ORAL_TABLET | Freq: Every day | ORAL | Status: DC
  Administered 2019-08-17 – 2019-08-18 (×2): 1 via ORAL
  Filled 2019-08-16 (×2): qty 1

## 2019-08-16 MED ORDER — OXYTOCIN 40 UNITS IN NORMAL SALINE INFUSION - SIMPLE MED
1.0000 m[IU]/min | INTRAVENOUS | Status: DC
  Administered 2019-08-16: 2 m[IU]/min via INTRAVENOUS

## 2019-08-16 MED ORDER — LACTATED RINGERS AMNIOINFUSION: INTRAVENOUS | Status: DC

## 2019-08-16 MED ORDER — SENNOSIDES-DOCUSATE SODIUM 8.6-50 MG PO TABS
2.0000 | ORAL_TABLET | ORAL | Status: DC
  Administered 2019-08-16 – 2019-08-18 (×2): 2 via ORAL
  Filled 2019-08-16 (×2): qty 2

## 2019-08-16 NOTE — Progress Notes (Signed)
LABOR PROGRESS NOTE  Sandeep Delagarza is a 29 y.o. G2P0010 at [redacted]w[redacted]d admitted for IOL for PreE with Severe Features.   Subjective: Patient continues to rest comfortably in bed, FB remains in place. Feeling contractions.   Objective: BP (!) 143/79   Pulse 84   Temp 99.4 F (37.4 C) (Oral)   Resp 16   Ht 5\' 5"  (1.651 m)   Wt 110.6 kg   LMP 12/18/2018 (Approximate)   SpO2 98%   BMI 40.59 kg/m  or  Vitals:   08/16/19 0101 08/16/19 0154 08/16/19 0202 08/16/19 0302  BP: 134/77  133/78 (!) 143/79  Pulse: 77  81 84  Resp: 16  16 16   Temp:  99.4 F (37.4 C)    TempSrc:  Oral    SpO2:      Weight:      Height:       Dilation: Closed Effacement (%): Thick Presentation: Vertex Exam by:: Dr. FHT: baseline rate 120, moderate varibility, 15x15 accel, 0 decels Toco: continue every 1-5 minutes  Labs: Lab Results  Component Value Date   WBC 5.8 08/15/2019   HGB 9.4 (L) 08/15/2019   HCT 28.9 (L) 08/15/2019   MCV 85.3 08/15/2019   PLT 204 08/15/2019    Patient Active Problem List   Diagnosis Date Noted  . Preeclampsia, severe, third trimester 08/15/2019  . Obesity in pregnancy 05/07/2019  . Headache in pregnancy, antepartum, second trimester 04/09/2019  . Supervision of normal first pregnancy, antepartum 01/28/2019    Assessment / Plan: 29 y.o. G2P0010 at [redacted]w[redacted]d here for IOL for PreE with severe features.  Labor: Early, latent. FB remains in place. Most recent Cyto given at 0153 buccal (Cyto #4), FB did not come out when tugged.  Fetal Wellbeing:  Category 1 (see strip details above) Pain Control:  Tylenol PRN headaches, has IV fentanyl PRN severe pain ordered Anticipated MOD:  Vaginal   37, DO [facility], PGY-2 08/16/2019 3:09 AM

## 2019-08-16 NOTE — Progress Notes (Signed)
Labor Progress Note Nancy Hatfield is a 29 y.o. G2P0010 at [redacted]w[redacted]d presented for IOL for PreE with severe features. S:  No complaints. Feeling some mild contractions lasting for less than one minute at irregular intervals.  O:  BP 134/81   Pulse 91   Temp 99 F (37.2 C) (Axillary)   Resp 16   Ht 5\' 5"  (1.651 m)   Wt 110.6 kg   LMP 12/18/2018 (Approximate)   SpO2 98%   BMI 40.59 kg/m  EFM: 125/moderate/no decels  CVE: Dilation: 5 Effacement (%): 60 Station: -3, -2 Presentation: Vertex Exam by:: [email or web address]   [name]: 29 y.o. G2P0010 [redacted]w[redacted]d for IOL for PreE with severe features. #Labor: FB out. Cytotec given 0605. #Pain: Analgesia PRN #FWB: Cat 1 #GBS negative   0606, DO 9:14 AM

## 2019-08-16 NOTE — Progress Notes (Signed)
Labor Progress Note Nancy Hatfield is a 29 y.o. G2P0010 at [redacted]w[redacted]d presented for IOL for preeclampsia with severe features.   S:  Patient resting comfortably.   O:  BP (!) 148/99   Pulse 83   Temp 98.3 F (36.8 C) (Oral)   Resp 16   Ht 5\' 5"  (1.651 m)   Wt 110.6 kg   LMP 12/18/2018 (Approximate)   SpO2 98%   BMI 40.59 kg/m   Fetal Tracing:  Baseline: 125 Variability: moderate Accels: none Decels: none   Toco: 2-5   CVE: Dilation: 6 Effacement (%): 80 Station: 0 Presentation: Vertex Exam by:: [email or web address], CNM    [name]: 29 y.o. G2P0010 [redacted]w[redacted]d IOL preeclampsia with severe features #Labor: Progressing well. Discussed with patient risks and benefits of AROM for augmentation of labor. Patient agreeable to plan of care. AROM with large amount of clear fluid. Patient and FHR tolerated procedure well.  #Pain: per patient request #FWB: Cat 1 #GBS negative  [redacted]w[redacted]d, CNM 4:43 PM

## 2019-08-16 NOTE — Progress Notes (Signed)
LABOR PROGRESS NOTE  Aradhana Gin is a 29 y.o. G2P0010 at [redacted]w[redacted]d admitted for IOL for PreE with Severe Features.   Subjective: She's resting in bed comfortably, feeling flushed occasionally, no headaches at this time s/p Tylenol, FB remains in place. Feeling contractions "frequently".   Objective: BP (!) 151/80   Pulse 74   Temp 97.7 F (36.5 C) (Axillary)   Resp 16   Ht 5\' 5"  (1.651 m)   Wt 110.6 kg   LMP 12/18/2018 (Approximate)   SpO2 98%   BMI 40.59 kg/m  or  Vitals:   08/15/19 2217 08/15/19 2303 08/15/19 2351 08/16/19 0002  BP: (!) 150/104 (!) 143/94  (!) 151/80  Pulse: 81 72  74  Resp: 16 16  16   Temp:   97.7 F (36.5 C)   TempSrc:   Axillary   SpO2:      Weight:      Height:       Dilation: Closed Effacement (%): Thick Presentation: Vertex Exam by:: Dr. FHT: baseline rate 120, moderate varibility, 15x15 accel, late and variable decels Toco: q1-5 minutes  Labs: Lab Results  Component Value Date   WBC 5.8 08/15/2019   HGB 9.4 (L) 08/15/2019   HCT 28.9 (L) 08/15/2019   MCV 85.3 08/15/2019   PLT 204 08/15/2019    Patient Active Problem List   Diagnosis Date Noted  . Preeclampsia, severe, third trimester 08/15/2019  . Obesity in pregnancy 05/07/2019  . Headache in pregnancy, antepartum, second trimester 04/09/2019  . Supervision of normal first pregnancy, antepartum 01/28/2019    Assessment / Plan: 29 y.o. G2P0010 at [redacted]w[redacted]d here for IOL for PreE with severe features.  Labor: Early, latent. Cytotec 37 buccal given at 2148, FB in place - did not come out when tugged.  Fetal Wellbeing:  Category 1 (see strip details above) Pain Control:  Tylenol PRN headaches, has IV fentanyl PRN severe pain ordered Anticipated MOD:  Vaginal   , DO [facility], PGY-2 08/16/2019 12:16 AM

## 2019-08-16 NOTE — Progress Notes (Signed)
Labor Progress Note Nancy Hatfield is a 29 y.o. G2P0010 at [redacted]w[redacted]d presented for PreE with severe features. S:  Feeling some contractions at irregular intervals but not complaining of pain.  O:  BP 134/86   Pulse 80   Temp 98 F (36.7 C) (Oral)   Resp 16   Ht 5\' 5"  (1.651 m)   Wt 110.6 kg   LMP 12/18/2018 (Approximate)   SpO2 98%   BMI 40.59 kg/m  EFM: 120/Moderate/acels  CVE: Dilation: 6 Effacement (%): 80 Station: 0 Presentation: Vertex Exam by:: [email or web address], CNM    [name]: 29 y.o. G2P0010 [redacted]w[redacted]d presenting for PreE with severe features. #Labor: Continue Pitocin #Pain: Analgesia PRN #FWB: Cat 1 #GBS negative   [redacted]w[redacted]d, DO 2:40 PM

## 2019-08-16 NOTE — Progress Notes (Signed)
Labor Progress Note Nancy Hatfield is a 29 y.o. G2P0010 at [redacted]w[redacted]d presented for IOL for PreE with severe features. S:  Continuing to do well without complaints.   O:  BP (!) 136/95   Pulse (!) 101   Temp 99 F (37.2 C) (Axillary)   Resp 18   Ht 5\' 5"  (1.651 m)   Wt 110.6 kg   LMP 12/18/2018 (Approximate)   SpO2 98%   BMI 40.59 kg/m  EFM: 120/Moderate/occasional variables  CVE: Dilation: 6 Effacement (%): 80 Station: 0 Presentation: Vertex Exam by:: [email or web address], CNM    [name]: 29 y.o. G2P0010 [redacted]w[redacted]d for IOL for PreE with severe features.  #Labor: Progressing well. Initiating Pitocin. #Pain: Analgesia PRN #FWB: Cat 1, occasional variables, otherwise reassuring #GBS negative  [redacted]w[redacted]d, DO 10:30 AM

## 2019-08-16 NOTE — Lactation Note (Addendum)
This note was copied from a baby's chart. Lactation Consultation Note  Patient Name: Nancy Hatfield IRWER'X Date: 08/16/2019 Reason for consult: Initial assessment;1st time breastfeeding;NICU baby P1, 4 hour preterm infant, ( 3 lbs 9.5 ounces) in NICU. Maternal  Hx: preeclampsia Mom plans to apply for the [facility] program referral sent for [facility] loaner DEBP. Mom doesn't have DEBP at home. LC discussed hand expression and mom taught back, colostrum currently not present.  Mom was given DEBP and understands to use DEBP every 3 hours for 15 minutes on initial setting to help establish milk supply due to infant separation infant in (NICU) and mom will hand express after pumping. Mom shown how to use DEBP & how to disassemble, clean, & reassemble parts. Mom fitted with size 30 breast flanges due to having large pendulous breast and nipples the  size of a  (quarter).  Mom will record infant crying, video infant and have infant clothing to help with visual ,smell and stimulation while pumping.  LC gave mom NICU booklet to follow their guidelines and procedures for milk storage. Mom knows to call RN or LC if she has any questions or concerns regarding breastfeeding. Mom made aware of O/P services, breastfeeding support groups, community resources, and our phone # for post-discharge questions.      Maternal Data Formula Feeding for Exclusion: No Has patient been taught Hand Expression?: Yes  Feeding Feeding Type: Donor Breast Milk  LATCH Score                   Interventions Interventions: Breast feeding basics reviewed;DEBP;Hand pump;Hand express;Expressed milk  Lactation Tools Discussed/Used Pump Review: Setup, frequency, and cleaning;Milk Storage Initiated by:: Danelle Earthly, IBCLC Date initiated:: 08/16/19   Consult Status Consult Status: Follow-up Date: 08/17/19 Follow-up type: In-patient    Danelle Earthly 08/16/2019, 10:54 PM

## 2019-08-16 NOTE — Discharge Summary (Addendum)
Postpartum Discharge Summary     Patient Name: Nancy Hatfield DOB: [birth year] MRN: [id number]  Date of admission: 08/15/2019 Delivering Provider: Wende Mott   Date of discharge: 08/18/2019  Admitting diagnosis: Preeclampsia, severe, third trimester [O14.13] Intrauterine pregnancy: [redacted]w[redacted]d    Secondary diagnosis:  Principal Problem:   Severe preeclampsia, delivered Active Problems:   Postpartum care following vaginal delivery   Obesity   Preterm delivery, delivered   Postpartum anemia  Additional problems: None     Discharge diagnosis: Preterm Pregnancy Delivered and Preeclampsia (severe)     Post partum procedures: Magnesium Sulfate for 24 hours for eclampsia prophylaxis  Augmentation: AROM, Pitocin, Cytotec and Foley Balloon  Complications: None  Hospital course:  Induction of Labor With Vaginal Delivery   29y.o. yo G2P0010 at 344w3das admitted to the hospital 08/15/2019 for induction of labor.  Indication for induction: preeclampsia with severe features.  Patient had an uncomplicated labor course as follows: Membrane Rupture Time/Date: 3:48 PM ,08/16/2019   Intrapartum Procedures: Episiotomy: None [1]                                         Lacerations:  1st degree [2]  Patient had delivery of a Viable infant on 08/16/2019.  Delivery time: 6:44 PM    Information for the patient's newborn:  Ro[place], [place]0[[id number]]Delivery Method: Vaginal, Spontaneous(Filed from Delivery Summary)  Details of delivery can be found in separate delivery note.  Patient received magnesium sulfate as per protocol for eclampsia prophylaxis, and had to receive a a couple of doses of IV Labetalol for BP control.  On PPD#2, her BP was persistently in 130s/80s, and was started on Procardia XL 30 mg daily. Also had asymptomatic postpartum anemia (hemoglobin dropped from 9.5 to 7.5);  received one dose of IV Feraheme, then started on oral iron therapy.  She had an otherwise routine  postpartum course. Patient is discharged home 08/18/19.  Magnesium Sulfate received: Yes BMZ received: Yes Rhophylac:No MMR:No Transfusion:No  Physical exam  Vitals:   08/17/19 1928 08/18/19 0055 08/18/19 0405 08/18/19 0844  BP: 138/86 134/85 130/82 (!) 128/92  Pulse: 73 69 75 65  Resp: 16 16 15 16   Temp: 98 F (36.7 C) 98.5 F (36.9 C) 98.5 F (36.9 C) 98.5 F (36.9 C)  TempSrc: Oral Oral Oral Oral  SpO2: 100% 96% 98% 97%  Weight:      Height:       General: alert, cooperative and no distress Lochia: appropriate Uterine Fundus: firm Incision: N/A DVT Evaluation: No evidence of DVT seen on physical exam. Negative Homan's sign. No cords or calf tenderness. Labs: CBC Latest Ref Rng & Units 08/17/2019 08/16/2019 08/15/2019  WBC 4.0 - 10.5 K/uL 15.2(H) 12.9(H) 5.8  Hemoglobin 12.0 - 15.0 g/dL 7.5(L) 9.5(L) 9.4(L)  Hematocrit 36.0 - 46.0 % 23.2(L) 29.1(L) 28.9(L)  Platelets 150 - 400 K/uL 225 252 204   CMP Latest Ref Rng & Units 08/16/2019 08/15/2019 07/04/2019  Glucose 70 - 99 mg/dL 107(H) 110(H) 168(H)  BUN 6 - 20 mg/dL 10 10 8   Creatinine 0.44 - 1.00 mg/dL 0.80 0.65 0.57  Sodium 135 - 145 mmol/L 131(L) 136 135  Potassium 3.5 - 5.1 mmol/L 4.5 4.5 4.2  Chloride 98 - 111 mmol/L 101 107 102  CO2 22 - 32 mmol/L 20(L) 19(L) 19(L)  Calcium 8.9 - 10.3 mg/dL 8.1(L)  8.9 9.9  Total Protein 6.5 - 8.1 g/dL 5.6(L) 5.5(L) 6.2  Total Bilirubin 0.3 - 1.2 mg/dL 0.4 0.4 <0.2  Alkaline Phos 38 - 126 U/L 160(H) 161(H) 113  AST 15 - 41 U/L 21 41 14  ALT 0 - 44 U/L 19 24 12     Edinburgh Score: Edinburgh Postnatal Depression Scale Screening Tool 08/16/2019  I have been able to laugh and see the funny side of things. 0  I have looked forward with enjoyment to things. 0  I have blamed myself unnecessarily when things went wrong. 2  I have been anxious or worried for no good reason. 0  I have felt scared or panicky for no good reason. 1  Things have been getting on top of me. 1  I have been  so unhappy that I have had difficulty sleeping. 0  I have felt sad or miserable. 0  I have been so unhappy that I have been crying. 0  The thought of harming myself has occurred to me. 0  Edinburgh Postnatal Depression Scale Total 4    Discharge instruction: per After Visit Summary and "Baby and Me Booklet".  After visit meds:  Allergies as of 08/18/2019   No Known Allergies     Medication List    STOP taking these medications   aspirin 81 MG chewable tablet     TAKE these medications   ferrous sulfate 325 (65 FE) MG tablet Commonly known as: FerrouSul Take 1 tablet (325 mg total) by mouth 2 (two) times daily.   ibuprofen 600 MG tablet Commonly known as: ADVIL Take 1 tablet (600 mg total) by mouth every 6 (six) hours.   iron polysaccharides 150 MG capsule Commonly known as: NIFEREX Take 1 capsule (150 mg total) by mouth 2 (two) times daily.   NIFEdipine 30 MG 24 hr tablet Commonly known as: ADALAT CC Take 1 tablet (30 mg total) by mouth daily.   senna-docusate 8.6-50 MG tablet Commonly known as: Senokot-S Take 2 tablets by mouth at bedtime as needed for mild constipation or moderate constipation.   Vitafol Gummies 3.33-0.333-34.8 MG Chew Chew 3 each by mouth daily.       Diet: routine diet  Activity: Advance as tolerated. Pelvic rest for 6 weeks.    Future Appointments  Date Time Provider [place]  08/25/2019 10:10 AM [place] None  09/18/2019 10:10 AM Laury Deep, CNM [facility] None    Newborn Data: Live born female  Birth Weight: 3 lb 9.5 oz (1630 g) APGAR: 5, 9   Newborn Delivery   Birth date/time: 08/16/2019 18:44:00 Delivery type: Vaginal, Spontaneous      Baby Feeding: Breast Disposition:NICU     Verita Schneiders, MD, [place], [facility] for [employer], [place]

## 2019-08-16 NOTE — Consult Note (Signed)
[facility]  [facility] 08/16/2019    5:12 PM  Neonatal Medicine Consultation         Nancy Hatfield          MRN:  [id number]  I was called at the request of the patient's obstetrician (Dr. Debroah Loop) to speak to this patient due to impending premature delivery at 34+ weeks.  The patient's prenatal course includes admission to the hospital on 2/26 for preeclampsia with severe features.  She is 34 3/7 weeks currently.  She is admitted to L&D where she began induction of labor last night.  She is receiving treatment that includes betamethasone (2/26 and 2/27), magnesium sulftate, labetalol, and penicillin (initially for unknown GBS status--testing in the hospital has found her to be negative).  The baby is a female.  I reviewed expectations for a baby born at 34+ weeks, including survival, length of stay, morbidities such as respiratory distress, infection, feeding issues.  I described how we provide respiratory and feeding support.  Mom plans to breast feed, so I discussed the availability of donor breast milk to help supplement her supply.    I spent 20 minutes reviewing the record, speaking to the patient, and entering appropriate documentation.  More than 50% of the time was spent face to face with patient.   _____________________ Angelita Ingles, MD Attending Neonatologist

## 2019-08-16 NOTE — Plan of Care (Signed)
Discussed plan of care, magnesium side effects and need for frequent assessments. Lactation has already seen patient and her first pump was done. Orientation to room done.

## 2019-08-17 LAB — CBC
HCT: 23.2 % — ABNORMAL LOW (ref 36.0–46.0)
Hemoglobin: 7.5 g/dL — ABNORMAL LOW (ref 12.0–15.0)
MCH: 27.6 pg (ref 26.0–34.0)
MCHC: 32.3 g/dL (ref 30.0–36.0)
MCV: 85.3 fL (ref 80.0–100.0)
Platelets: 225 10*3/uL (ref 150–400)
RBC: 2.72 MIL/uL — ABNORMAL LOW (ref 3.87–5.11)
RDW: 14.3 % (ref 11.5–15.5)
WBC: 15.2 10*3/uL — ABNORMAL HIGH (ref 4.0–10.5)
nRBC: 0 % (ref 0.0–0.2)

## 2019-08-17 MED ORDER — FERROUS SULFATE 325 (65 FE) MG PO TABS
325.0000 mg | ORAL_TABLET | Freq: Two times a day (BID) | ORAL | Status: DC
  Administered 2019-08-17 – 2019-08-18 (×3): 325 mg via ORAL
  Filled 2019-08-17 (×3): qty 1

## 2019-08-17 NOTE — Lactation Note (Signed)
This note was copied from a baby's chart. Lactation Consultation Note  Patient Name: Nancy Hatfield [id number] Date: 08/17/2019 Reason for consult: Follow-up assessment;Primapara;1st time breastfeeding;NICU baby;Infant < 6lbs;Late-preterm 34-36.6wks  Follow-up visit, OB specialty visit with Mom of a 11 hour old LPI NICU female <4 lbs. Mom has not pumped today due to breast pain on the left breast. LC examined both breast, no engorgement, swelling on nipple areola complex, tissue is non-compressible. Mom has flat nipples and do not evert with stimulation.   LC educated mom on the importance of a pumping schedule. Encouraged mom to pump every 2-3 hours. Mom is aware of the possibility of not getting any volume during pumping sessions at this time. LC educated mom on premature breastmilk and the benefits of it for the LPI. Due to areola and breast pain, LC asked RN for coconut oil and placed it on both breast prior to pumping. Mom states #30 flange and the coconut oil made her breast pain feel much better after receiving the oil and pumping.    Reviewed breastfeeding basics, hand expression, breast compressions, NICU booklet  and pamphlet given to mom.  Mom stated she plans to continue pumping. LC praised mom for her efforts. LC encouraged mom to call LC if she has any further questions or concerns. No further questions or concerns from mom at this time.   Maternal Data Has patient been taught Hand Expression?: Yes  Feeding    LATCH Score                   Interventions Interventions: Breast massage;Breast compression;DEBP;Coconut oil;Breast feeding basics reviewed  Lactation Tools Discussed/Used Tools: Pump;Coconut oil Breast pump type: Double-Electric Breast Pump   Consult Status Consult Status: Follow-up Date: 08/18/19 Follow-up type: In-patient    Nancy Hatfield 08/17/2019, 12:32 PM

## 2019-08-17 NOTE — Progress Notes (Signed)
Post Partum Day 1 Subjective: no complaints  Objective: Blood pressure 135/86, pulse 78, temperature 98 F (36.7 C), temperature source Oral, resp. rate 15, height 5\' 5"  (1.651 m), weight 110.6 kg, last menstrual period 12/18/2018, SpO2 96 %, unknown if currently breastfeeding.  Physical Exam:  General: alert, cooperative and no distress Lochia: appropriate Uterine Fundus: firm DVT Evaluation: No evidence of DVT seen on physical exam.  Recent Labs    08/16/19 0708 08/17/19 0542  HGB 9.5* 7.5*  HCT 29.1* 23.2*    Assessment/Plan: 24 hr magnesium IV postpartum   LOS: 2 days   08/19/19 08/17/2019, 7:41 AM

## 2019-08-18 DIAGNOSIS — O9081 Anemia of the puerperium: Secondary | ICD-10-CM | POA: Diagnosis not present

## 2019-08-18 LAB — GC/CHLAMYDIA PROBE AMP (~~LOC~~) NOT AT ARMC
Chlamydia: NEGATIVE
Comment: NEGATIVE
Comment: NORMAL
Neisseria Gonorrhea: NEGATIVE

## 2019-08-18 MED ORDER — NIFEDIPINE ER 30 MG PO TB24: 30.0000 mg | ORAL_TABLET | Freq: Every day | ORAL | 2 refills | Status: DC

## 2019-08-18 MED ORDER — IBUPROFEN 600 MG PO TABS: 600.0000 mg | ORAL_TABLET | Freq: Four times a day (QID) | ORAL | 2 refills | Status: AC

## 2019-08-18 MED ORDER — SENNOSIDES-DOCUSATE SODIUM 8.6-50 MG PO TABS: 2.0000 | ORAL_TABLET | Freq: Every evening | ORAL | 2 refills | Status: AC | PRN

## 2019-08-18 MED ORDER — NIFEDIPINE ER 30 MG PO TB24: 30.0000 mg | ORAL_TABLET | Freq: Every day | ORAL | 2 refills | Status: AC

## 2019-08-18 MED ORDER — FERROUS SULFATE 325 (65 FE) MG PO TABS: 325.0000 mg | ORAL_TABLET | Freq: Two times a day (BID) | ORAL | 2 refills | Status: AC

## 2019-08-18 MED ORDER — POLYSACCHARIDE IRON COMPLEX 150 MG PO CAPS: 150.0000 mg | ORAL_CAPSULE | Freq: Two times a day (BID) | ORAL | 2 refills | Status: DC

## 2019-08-18 MED ORDER — POLYSACCHARIDE IRON COMPLEX 150 MG PO CAPS: 150.0000 mg | ORAL_CAPSULE | Freq: Two times a day (BID) | ORAL | Status: DC

## 2019-08-18 MED ORDER — NIFEDIPINE ER OSMOTIC RELEASE 30 MG PO TB24
30.0000 mg | ORAL_TABLET | Freq: Every day | ORAL | Status: DC
  Administered 2019-08-18: 30 mg via ORAL
  Filled 2019-08-18: qty 1

## 2019-08-18 MED ORDER — SODIUM CHLORIDE 0.9 % IV SOLN
510.0000 mg | Freq: Once | INTRAVENOUS | Status: AC
  Administered 2019-08-18: 510 mg via INTRAVENOUS
  Filled 2019-08-18: qty 17

## 2019-08-18 MED FILL — NIFEdipine ER 30 MG TB24: 30 | 30 days supply | Qty: 30 | Fill #0

## 2019-08-18 MED FILL — FERROUS SULFATE 325 MG TAB: 325 (65 FE) | 30 days supply | Qty: 60 | Fill #0

## 2019-08-18 MED FILL — IBUPROFEN 600 MG TABS: 600 | 8 days supply | Qty: 30 | Fill #0

## 2019-08-18 MED FILL — SENEXON-S 8.6-50 MG TABS: 8.6-50 | 15 days supply | Qty: 30 | Fill #0

## 2019-08-18 NOTE — Progress Notes (Signed)
Discharge instructions and prescriptions given to pt. Discussed post vaginal delivery care, pre-eclampsia, signs and symptoms to report to the MD, upcoming appointments, and meds. Pt verbalizes understanding and has no questions or concerns at this time. Pt discharged from hospital in stable condition.

## 2019-08-18 NOTE — Lactation Note (Signed)
This note was copied from a baby's chart. Lactation Consultation Note  Patient Name: Nancy Hatfield [id number] Date: 08/18/2019 Reason for consult: Follow-up assessment   LC Visit:  Attempted to visit with mother in NICU, however, she is not there.  Will attempt at a later time today.  Consult Status Consult Status: Follow-up Date: 08/18/19 Follow-up type: In-patient    Abdishakur Gottschall R Anilah Huck 08/18/2019, 9:17 AM

## 2019-08-18 NOTE — Lactation Note (Signed)
This note was copied from a baby's chart. Lactation Consultation Note  Patient Name: Boy Vaniya Augspurger [id number] Date: 08/18/2019 Reason for consult: Follow-up assessment   LC Visit:  Attempted to visit with mother, however, she is not in her room.  Will go to the NICU for my visit and follow up there later today.              Consult Status Consult Status: Follow-up Date: 08/18/19 Follow-up type: In-patient    Shawnetta Lein R Lorenz Donley 08/18/2019, 9:08 AM

## 2019-08-18 NOTE — Discharge Instructions (Signed)
Postpartum Care After Vaginal Delivery This sheet gives you information about how to care for yourself from the time you deliver your baby to up to 6-12 weeks after delivery (postpartum period). Your health care provider may also give you more specific instructions. If you have problems or questions, contact your health care provider. Follow these instructions at home: Vaginal bleeding  It is normal to have vaginal bleeding (lochia) after delivery. Wear a sanitary pad for vaginal bleeding and discharge. ? During the first week after delivery, the amount and appearance of lochia is often similar to a menstrual period. ? Over the next few weeks, it will gradually decrease to a dry, yellow-brown discharge. ? For most women, lochia stops completely by 4-6 weeks after delivery. Vaginal bleeding can vary from woman to woman.  Change your sanitary pads frequently. Watch for any changes in your flow, such as: ? A sudden increase in volume. ? A change in color. ? Large blood clots.  If you pass a blood clot from your vagina, save it and call your health care provider to discuss. Do not flush blood clots down the toilet before talking with your health care provider.  Do not use tampons or douches until your health care provider says this is safe.  If you are not breastfeeding, your period should return 6-8 weeks after delivery. If you are feeding your child breast milk only (exclusive breastfeeding), your period may not return until you stop breastfeeding. Perineal care  Keep the area between the vagina and the anus (perineum) clean and dry as told by your health care provider. Use medicated pads and pain-relieving sprays and creams as directed.  If you had a cut in the perineum (episiotomy) or a tear in the vagina, check the area for signs of infection until you are healed. Check for: ? More redness, swelling, or pain. ? Fluid or blood coming from the cut or tear. ? Warmth. ? Pus or a bad  smell.  You may be given a squirt bottle to use instead of wiping to clean the perineum area after you go to the bathroom. As you start healing, you may use the squirt bottle before wiping yourself. Make sure to wipe gently.  To relieve pain caused by an episiotomy, a tear in the vagina, or swollen veins in the anus (hemorrhoids), try taking a warm sitz bath 2-3 times a day. A sitz bath is a warm water bath that is taken while you are sitting down. The water should only come up to your hips and should cover your buttocks. Breast care  Within the first few days after delivery, your breasts may feel heavy, full, and uncomfortable (breast engorgement). Milk may also leak from your breasts. Your health care provider can suggest ways to help relieve the discomfort. Breast engorgement should go away within a few days.  If you are breastfeeding: ? Wear a bra that supports your breasts and fits you well. ? Keep your nipples clean and dry. Apply creams and ointments as told by your health care provider. ? You may need to use breast pads to absorb milk that leaks from your breasts. ? You may have uterine contractions every time you breastfeed for up to several weeks after delivery. Uterine contractions help your uterus return to its normal size. ? If you have any problems with breastfeeding, work with your health care provider or lactation consultant.  If you are not breastfeeding: ? Avoid touching your breasts a lot. Doing this can make   your breasts produce more milk. ? Wear a good-fitting bra and use cold packs to help with swelling. ? Do not squeeze out (express) milk. This causes you to make more milk. Intimacy and sexuality  Ask your health care provider when you can engage in sexual activity. This may depend on: ? Your risk of infection. ? How fast you are healing. ? Your comfort and desire to engage in sexual activity.  You are able to get pregnant after delivery, even if you have not had  your period. If desired, talk with your health care provider about methods of birth control (contraception). Medicines  Take over-the-counter and prescription medicines only as told by your health care provider.  If you were prescribed an antibiotic medicine, take it as told by your health care provider. Do not stop taking the antibiotic even if you start to feel better. Activity  Gradually return to your normal activities as told by your health care provider. Ask your health care provider what activities are safe for you.  Rest as much as possible. Try to rest or take a nap while your baby is sleeping. Eating and drinking   Drink enough fluid to keep your urine pale yellow.  Eat high-fiber foods every day. These may help prevent or relieve constipation. High-fiber foods include: ? Whole grain cereals and breads. ? Brown rice. ? Beans. ? Fresh fruits and vegetables.  Do not try to lose weight quickly by cutting back on calories.  Take your prenatal vitamins until your postpartum checkup or until your health care provider tells you it is okay to stop. Lifestyle  Do not use any products that contain nicotine or tobacco, such as cigarettes and e-cigarettes. If you need help quitting, ask your health care provider.  Do not drink alcohol, especially if you are breastfeeding. General instructions  Keep all follow-up visits for you and your baby as told by your health care provider. Most women visit their health care provider for a postpartum checkup within the first 3-6 weeks after delivery. Contact a health care provider if:  You feel unable to cope with the changes that your child brings to your life, and these feelings do not go away.  You feel unusually sad or worried.  Your breasts become red, painful, or hard.  You have a fever.  You have trouble holding urine or keeping urine from leaking.  You have little or no interest in activities you used to enjoy.  You have not  breastfed at all and you have not had a menstrual period for 12 weeks after delivery.  You have stopped breastfeeding and you have not had a menstrual period for 12 weeks after you stopped breastfeeding.  You have questions about caring for yourself or your baby.  You pass a blood clot from your vagina. Get help right away if:  You have chest pain.  You have difficulty breathing.  You have sudden, severe leg pain.  You have severe pain or cramping in your lower abdomen.  You bleed from your vagina so much that you fill more than one sanitary pad in one hour. Bleeding should not be heavier than your heaviest period.  You develop a severe headache.  You faint.  You have blurred vision or spots in your vision.  You have bad-smelling vaginal discharge.  You have thoughts about hurting yourself or your baby. If you ever feel like you may hurt yourself or others, or have thoughts about taking your own life, get help   right away. You can go to the nearest emergency department or call:  Your local emergency services (911 in the [place]  A suicide crisis helpline, such as the National Suicide Prevention Lifeline at 1-800-273-8255. This is open 24 hours a day. Summary  The period of time right after you deliver your newborn up to 6-12 weeks after delivery is called the postpartum period.  Gradually return to your normal activities as told by your health care provider.  Keep all follow-up visits for you and your baby as told by your health care provider. This information is not intended to replace advice given to you by your health care provider. Make sure you discuss any questions you have with your health care provider. Document Revised: 06/08/2017 Document Reviewed: 03/19/2017 Elsevier Patient Education  2020 Elsevier Inc. Postpartum Hypertension Postpartum hypertension is high blood pressure that remains higher than normal after childbirth. You may not realize that you have  postpartum hypertension if your blood pressure is not being checked regularly. In most cases, postpartum hypertension will go away on its own, usually within a week of delivery. However, for some women, medical treatment is required to prevent serious complications, such as seizures or stroke. What are the causes? This condition may be caused by one or more of the following:  Hypertension that existed before pregnancy (chronic hypertension).  Hypertension that comes on as a result of pregnancy (gestational hypertension).  Hypertensive disorders during pregnancy (preeclampsia) or seizures in women who have high blood pressure during pregnancy (eclampsia).  A condition in which the liver, platelets, and red blood cells are damaged during pregnancy (HELLP syndrome).  A condition in which the thyroid produces too much hormones (hyperthyroidism).  Other rare problems of the nerves (neurological disorders) or blood disorders. In some cases, the cause may not be known. What increases the risk? The following factors may make you more likely to develop this condition:  Chronic hypertension. In some cases, this may not have been diagnosed before pregnancy.  Obesity.  Type 2 diabetes.  Kidney disease.  History of preeclampsia or eclampsia.  Other medical conditions that change the level of hormones in the body (hormonal imbalance). What are the signs or symptoms? As with all types of hypertension, postpartum hypertension may not have any symptoms. Depending on how high your blood pressure is, you may experience:  Headaches. These may be mild, moderate, or severe. They may also be steady, constant, or sudden in onset (thunderclap headache).  Changes in your ability to see (visual changes).  Dizziness.  Shortness of breath.  Swelling of your hands, feet, lower legs, or face. In some cases, you may have swelling in more than one of these locations.  Heart palpitations or a racing  heartbeat.  Difficulty breathing while lying down.  Decrease in the amount of urine that you pass. Other rare signs and symptoms may include:  Sweating more than usual. This lasts longer than a few days after delivery.  Chest pain.  Sudden dizziness when you get up from sitting or lying down.  Seizures.  Nausea or vomiting.  Abdominal pain. How is this diagnosed? This condition may be diagnosed based on the results of a physical exam, blood pressure measurements, and blood and urine tests. You may also have other tests, such as a CT scan or an MRI, to check for other problems of postpartum hypertension. How is this treated? If blood pressure is high enough to require treatment, your options may include:  Medicines to reduce blood pressure (  antihypertensives). Tell your health care provider if you are breastfeeding or if you plan to breastfeed. There are many antihypertensive medicines that are safe to take while breastfeeding.  Stopping medicines that may be causing hypertension.  Treating medical conditions that are causing hypertension.  Treating the complications of hypertension, such as seizures, stroke, or kidney problems. Your health care provider will also continue to monitor your blood pressure closely until it is within a safe range for you. Follow these instructions at home:  Take over-the-counter and prescription medicines only as told by your health care provider.  Return to your normal activities as told by your health care provider. Ask your health care provider what activities are safe for you.  Do not use any products that contain nicotine or tobacco, such as cigarettes and e-cigarettes. If you need help quitting, ask your health care provider.  Keep all follow-up visits as told by your health care provider. This is important. Contact a health care provider if:  Your symptoms get worse.  You have new symptoms, such as: ? A headache that does not get  better. ? Dizziness. ? Visual changes. Get help right away if:  You suddenly develop swelling in your hands, ankles, or face.  You have sudden, rapid weight gain.  You develop difficulty breathing, chest pain, racing heartbeat, or heart palpitations.  You develop severe pain in your abdomen.  You have any symptoms of a stroke. "BE FAST" is an easy way to remember the main warning signs of a stroke: ? B - Balance. Signs are dizziness, sudden trouble walking, or loss of balance. ? E - Eyes. Signs are trouble seeing or a sudden change in vision. ? F - Face. Signs are sudden weakness or numbness of the face, or the face or eyelid drooping on one side. ? A - Arms. Signs are weakness or numbness in an arm. This happens suddenly and usually on one side of the body. ? S - Speech. Signs are sudden trouble speaking, slurred speech, or trouble understanding what people say. ? T - Time. Time to call emergency services. Write down what time symptoms started.  You have other signs of a stroke, such as: ? A sudden, severe headache with no known cause. ? Nausea or vomiting. ? Seizure. These symptoms may represent a serious problem that is an emergency. Do not wait to see if the symptoms will go away. Get medical help right away. Call your local emergency services (911 in the [place] Do not drive yourself to the hospital. Summary  Postpartum hypertension is high blood pressure that remains higher than normal after childbirth.  In most cases, postpartum hypertension will go away on its own, usually within a week of delivery.  For some women, medical treatment is required to prevent serious complications, such as seizures or stroke. This information is not intended to replace advice given to you by your health care provider. Make sure you discuss any questions you have with your health care provider. Document Revised: 07/12/2018 Document Reviewed: 03/26/2017 Elsevier Patient Education  2020 Elsevier  Inc.  

## 2019-08-25 ENCOUNTER — Ambulatory Visit (INDEPENDENT_AMBULATORY_CARE_PROVIDER_SITE_OTHER): Payer: Medicaid Other | Admitting: *Deleted

## 2019-08-25 ENCOUNTER — Other Ambulatory Visit: Payer: Self-pay

## 2019-08-25 VITALS — BP 129/86 | HR 94

## 2019-08-25 DIAGNOSIS — O165 Unspecified maternal hypertension, complicating the puerperium: Secondary | ICD-10-CM

## 2019-08-25 NOTE — Progress Notes (Signed)
    Virtual Visit via Telephone Note  I connected with [name] on 08/25/19 at 10:10 AM EST by telephone and verified that I am speaking with the correct person using two identifiers.  Location: Patient: Nancy Hatfield MRN: [id number] Provider: Clovis Pu, RN   I discussed the limitations, risks, security and privacy concerns of performing an evaluation and management service by telephone and the availability of in person appointments. I also discussed with the patient that there may be a patient responsible charge related to this service. The patient expressed understanding and agreed to proceed.  Subjective:  Nancy Hatfield is a 29 y.o. female here for BP check.   Hypertension ROS: taking medications as instructed, no medication side effects noted, no TIA's, no chest pain on exertion, no dyspnea on exertion, no swelling of ankles, no orthostatic dizziness or lightheadedness, no orthopnea or paroxysmal nocturnal dyspnea and no palpitations.    Objective:  BP 129/86 (Patient Position: Sitting, Cuff Size: Normal)   Pulse 94   LMP 12/18/2018 (Approximate)   Appearance oriented to person, place, and time. General exam BP noted to be well controlled today in office.    Assessment:   Blood Pressure well controlled, stable, improved and asymptomatic.   Plan:  Current treatment plan is effective, no change in therapy..  Follow Up Instructions:   I discussed the assessment and treatment plan with the patient. The patient was provided an opportunity to ask questions and all were answered. The patient agreed with the plan and demonstrated an understanding of the instructions.   The patient was advised to call back or seek an in-person evaluation if the symptoms worsen or if the condition fails to improve as anticipated.  I provided 10 minutes of non-face-to-face time during this encounter.   Clovis Pu, RN

## 2019-08-28 ENCOUNTER — Encounter: Payer: Self-pay | Admitting: Obstetrics and Gynecology

## 2019-09-09 ENCOUNTER — Ambulatory Visit: Payer: Self-pay

## 2019-09-09 NOTE — Lactation Note (Signed)
This note was copied from a baby's chart. Lactation Consultation Note  Patient Name: Nancy Hatfield IRJJO'A Date: 09/09/2019 Reason for consult: Follow-up assessment;NICU baby;Early term 53-38.6wks   Baby 60 weeks old in NICU.  [redacted]w[redacted]d CGA.   Assisted with first attempt at breastfeeding. Mother admits she has not been pumping on a regular basis so as of yesterday she started pumping more regularly q 3 hours.  Also discussed pumping 2 - 2.5 hrs during the day and q 3 hours at night. When she pumped randomly (less than 8 sessions per day) she would express 1-2 oz.  Now that she has started pumping q 3 hours she is getting 10-20 ml. Encouraged hand expressing before and after feedings and using hands on pumping. Mother has made a hands free pumping bra.  Assisted w/ placing baby at breast with hand expressed drops. Baby opened briefly and mouthed nipple but did not sustain latch, no sucks. Attempted with #20 & #24NS but baby did not open wide enough. Encouraged lick and learn and frequent STS time. Suggest mother call to arrange for assistance with additional feedings.     Maternal Data    Feeding Feeding Type: Formula Nipple Type: Nfant Extra Slow Flow (gold)  LATCH Score Latch: Too sleepy or reluctant, no latch achieved, no sucking elicited.  Audible Swallowing: None  Type of Nipple: Everted at rest and after stimulation  Comfort (Breast/Nipple): Soft / non-tender  Hold (Positioning): Assistance needed to correctly position infant at breast and maintain latch.  LATCH Score: 5  Interventions Interventions: Breast feeding basics reviewed;Assisted with latch;Hand express;Support pillows;DEBP  Lactation Tools Discussed/Used     Consult Status Consult Status: Follow-up Date: 09/11/19 Follow-up type: In-patient    Dahlia Byes [facility] 09/09/2019, 11:20 AM

## 2019-09-10 ENCOUNTER — Ambulatory Visit: Payer: Self-pay

## 2019-09-10 NOTE — Lactation Note (Signed)
This note was copied from a baby's chart. Lactation Consultation Note  Patient Name: Nancy Hatfield [id number]'P Date: 09/10/2019 Reason for consult: Follow-up assessment;Primapara;1st time breastfeeding;NICU baby;Early term 37-38.6wks  LC in to visit with P1 Mom of 3 weeks.  (AGA [redacted]w[redacted]d) Baby is now able to go to breast when Mom is present.  Baby being gavage fed formula and EBM.   Baby sleepy and not rooting.  Placed baby STS on Mom's chest and he fell asleep.  Mom hand expressing milk to offer a little by her finger and baby still didn't wake and show interest.  Reassured Mom.   Mom questioned about her flange size.  Mom using 30 mm flanges.  Mom has large breasts and small diameter nipples.  24 mm flanges initiated and watched Mom pump on both sides.  24 mm appears to be the correct size.  Reviewed importance of pumping >8 times per 24hrs and how to use the regular setting NOT THE INITIATION SETTING anymore.   Interventions Interventions: Breast feeding basics reviewed;Assisted with latch;Skin to skin;Breast massage;Hand express;Breast compression;Adjust position;Reverse pressure;Support pillows;Position options;DEBP  Lactation Tools Discussed/Used Tools: Pump Breast pump type: Double-Electric Breast Pump   Consult Status Consult Status: Follow-up Date: 09/17/19 Follow-up type: In-patient    Judee Clara 09/10/2019, 5:49 PM

## 2019-09-13 ENCOUNTER — Ambulatory Visit: Payer: Self-pay

## 2019-09-13 NOTE — Lactation Note (Signed)
This note was copied from a baby's chart. Lactation Consultation Note  Patient Name: Nancy Hatfield [id number] Date: 09/13/2019 Reason for consult: Follow-up assessment;Primapara;1st time breastfeeding;NICU baby;Early term 55-38.6wks  Visited with mom of NICU pre-term baby with adjusted G.A @ 38 3/7. Baby is on breastmilk and also Similac 24 and 30 calorie count formula. Mom told LC that she scheduled a LC appt for a feeding assist last week but that baby was too sleepy and didn't do much.  Mom feeding baby a bottle of formula when entering the room, baby already falling asleep. Advised mom how she can hand expressed and rub some colostrum on baby's lips to entice some lick and learn. Mom understands expectations and she's agreeable with taking baby to breast STS and just "practice at the breast".  She's now pumping every 4-6 hours and getting between 30-60 ml per pumping session. Mom voiced "the closer I pump, the less I get". Explained supply and demand and how spaced feedings could hurt her supply on the long term. LC also discussed lactogenesis and return of menses with mom; since it's been already 4 weeks since baby's birth.   Mom is now using flange # 24 and they're working out for her. Will try to pump more often, ideally every 3-4 hours, without going more than 6 hours without pumping (at night). Mom reported all questions and concerns were answered, she's aware of LC OP services and will call lactation next week for another feeding assist.   Maternal Data    Feeding Feeding Type: Breast Milk  LATCH Score                   Interventions Interventions: Breast feeding basics reviewed;Coconut oil  Lactation Tools Discussed/Used Tools: Flanges;Pump Flange Size: 24 Breast pump type: Double-Electric Breast Pump   Consult Status Consult Status: PRN Follow-up type: In-patient    Nancy Hatfield 09/13/2019, 8:41 PM

## 2019-09-17 ENCOUNTER — Ambulatory Visit: Payer: Self-pay

## 2019-09-17 NOTE — Lactation Note (Signed)
This note was copied from a baby's chart. Lactation Consultation Note  Patient Name: Nancy Hatfield [id number]'V Date: 09/17/2019 Reason for consult: Follow-up assessment;1st time breastfeeding;Primapara;Early term 37-38.6wks;NICU baby  Mom and baby are going home today, he's at 39 weeks 0 day adjusted G.A. Per mom baby is going to breast but she hasn't "latch" yet. Mom didn't schedule a feeding assist after last LC appt but she said she'll try to latch baby at home on her own and she'll schedule a LC OP appt if needed.  Baby on the car seat and NICU RN reviewing discharge instructions with mom, mom reported all questions and concerns were answered, she's aware of LC OP services and will call PRN.  Maternal Data    Feeding Feeding Type: Formula Nipple Type: Nfant Extra Slow Flow (gold)  LATCH Score                   Interventions Interventions: Breast feeding basics reviewed  Lactation Tools Discussed/Used     Consult Status Consult Status: PRN Follow-up type: Out-patient    Mikhia Dusek S Kenneisha Cochrane 09/17/2019, 3:01 PM

## 2019-09-18 ENCOUNTER — Ambulatory Visit: Payer: Medicaid Other | Admitting: Obstetrics and Gynecology

## 2019-10-01 ENCOUNTER — Ambulatory Visit (INDEPENDENT_AMBULATORY_CARE_PROVIDER_SITE_OTHER): Payer: Medicaid Other | Admitting: Student

## 2019-10-01 ENCOUNTER — Other Ambulatory Visit: Payer: Self-pay

## 2019-10-01 ENCOUNTER — Encounter: Payer: Self-pay | Admitting: Student

## 2019-10-01 NOTE — Patient Instructions (Signed)

## 2019-10-01 NOTE — Progress Notes (Signed)
    Post Partum Visit Note  Nancy Hatfield is a 29 y.o. [phone] female who presents for a postpartum visit. She is 7 weeks postpartum following a normal spontaneous vaginal delivery.  I have fully reviewed the prenatal and intrapartum course. The delivery was at 34 gestational weeks.  Anesthesia: none. Postpartum course has been normal. Baby is doing well; home after spending 32 days in NICU. Baby is feeding by both breast and bottle - Similac Neosure. Bleeding no bleeding. Bowel function is normal. Bladder function is normal. Patient is sexually active. Contraception method is desires IUD. Postpartum depression screening: negative.  The following portions of the patient's history were reviewed and updated as appropriate: allergies, current medications, past family history, past medical history, past social history, past surgical history and problem list.  Review of Systems Pertinent items are noted in HPI.    Objective:  Blood pressure 118/80, pulse 97, temperature 98.2 F (36.8 C), temperature source Oral, height 5' 4.5" (1.638 m), weight 224 lb 12.8 oz (102 kg), last menstrual period 12/18/2018, currently breastfeeding.  General:  alert, cooperative and appears stated age  Lungs: clear to auscultation bilaterally  Heart:  regular rate and rhythm, S1, S2 normal, no murmur, click, rub or gallop  Abdomen: soft, non-tender; bowel sounds normal; no masses,  no organomegaly   Vulva:  not evaluated  Vagina: not evaluated        Assessment:    Normal postpartum exam. Pap smear not done at today's visit.   BPs normal. Hasn't been on antihypertensive x 2 weeks. Does not need to restart at this time.  Plan:   Essential components of care per ACOG recommendations:  1.  Mood and well being: Patient with negative depression screening today. Reviewed local resources for support.  - Patient does not use tobacco.  - hx of drug use? No    2. Infant care and feeding:  -Patient currently  breastmilk feeding? Yes & formula - preterm delivery, latch issues, following lactation -Social determinants of health (SDOH) reviewed in EPIC. No concerns  3. Sexuality, contraception and birth spacing - Patient does not want a pregnancy in the next year.  Desired family size is unknown children.  - Reviewed forms of contraception in tiered fashion. Patient desired IUD -- leaning towards paragard but unsure, will return in 2 weeks for placement   - Discussed birth spacing of 18 months  4. Sleep and fatigue -Encouraged family/partner/community support of 4 hrs of uninterrupted sleep to help with mood and fatigue  5. Physical Recovery  - Discussed patients delivery and complications - Patient had a first degree laceration, perineal healing reviewed. Patient expressed understanding - Patient has urinary incontinence? No - Patient is safe to resume physical and sexual activity  6.  Health Maintenance - Last pap smear done 03/12/2019 and was normal with negative HPV.   7. No Chronic Disease - PCP follow up  Judeth Horn, NP [facility] [employer] [facility]

## 2019-10-09 DIAGNOSIS — Z23 Encounter for immunization: Secondary | ICD-10-CM | POA: Diagnosis not present

## 2019-10-15 ENCOUNTER — Encounter: Payer: Self-pay | Admitting: Women's Health

## 2019-10-15 ENCOUNTER — Other Ambulatory Visit: Payer: Self-pay

## 2019-10-15 ENCOUNTER — Ambulatory Visit (INDEPENDENT_AMBULATORY_CARE_PROVIDER_SITE_OTHER): Payer: Medicaid Other | Admitting: *Deleted

## 2019-10-15 ENCOUNTER — Encounter: Payer: Medicaid Other | Admitting: Women's Health

## 2019-10-15 VITALS — BP 117/81 | HR 79 | Temp 97.8°F | Ht 64.0 in | Wt 204.0 lb

## 2019-10-15 VITALS — BP 117/81 | HR 79 | Temp 97.8°F | Ht 64.5 in | Wt 204.0 lb

## 2019-10-15 DIAGNOSIS — Z3044 Encounter for surveillance of vaginal ring hormonal contraceptive device: Secondary | ICD-10-CM

## 2019-10-15 LAB — POCT URINE PREGNANCY: Preg Test, Ur: NEGATIVE

## 2019-10-15 MED ORDER — ETONOGESTREL-ETHINYL ESTRADIOL 0.12-0.015 MG/24HR VA RING: VAGINAL_RING | VAGINAL | 0 refills | Status: AC

## 2019-10-15 NOTE — Progress Notes (Signed)
Nuvaring samples given.  Clovis Pu, RN

## 2019-10-16 NOTE — Progress Notes (Signed)
Visit cancelled.

## 2020-01-18 IMAGING — US US MFM OB FOLLOW-UP
1 series · 13 of 28 positions shown · non-contrast
Comparison: none

[Series 1: us mfm ob follow-up · 13 of 61 slices shown]
[im 3/61]
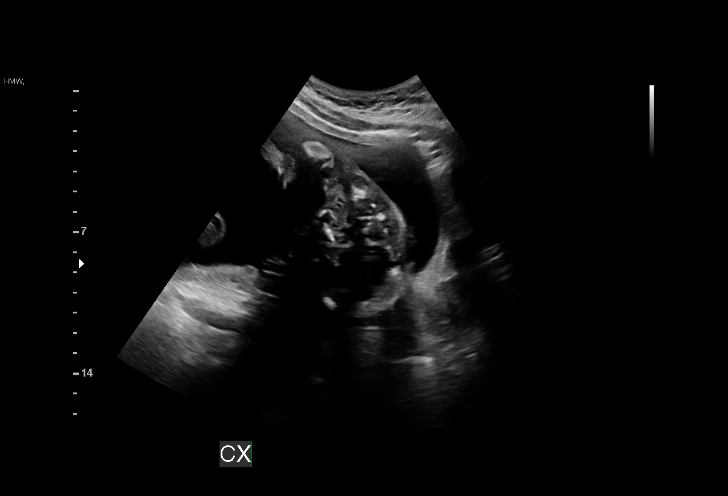
[im 7/61]
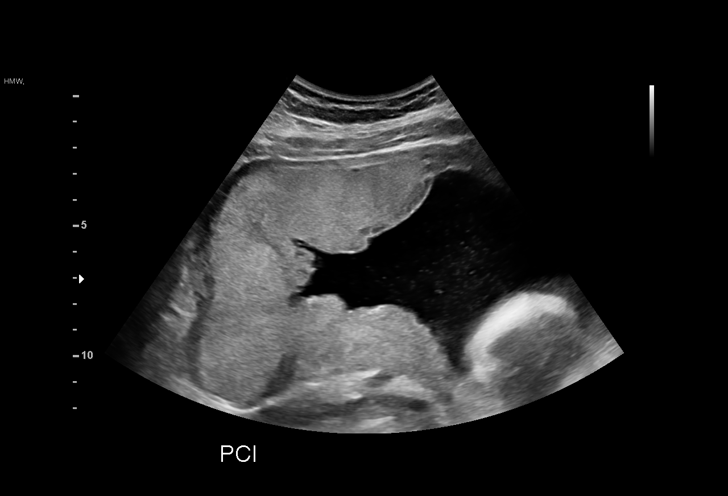
[im 12/61]
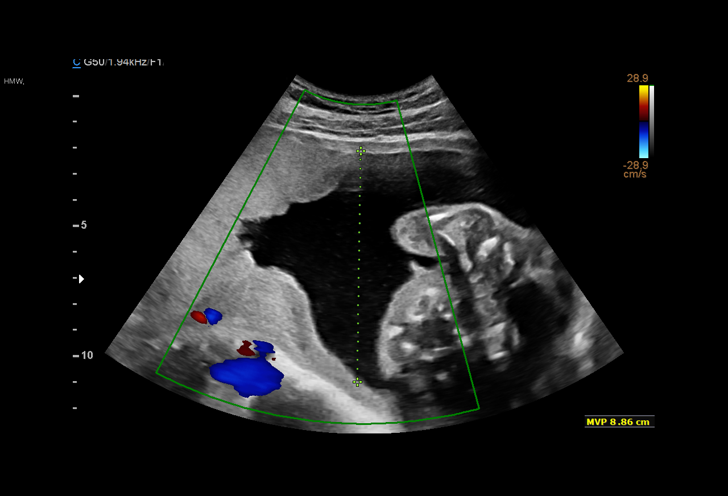
[im 16/61]
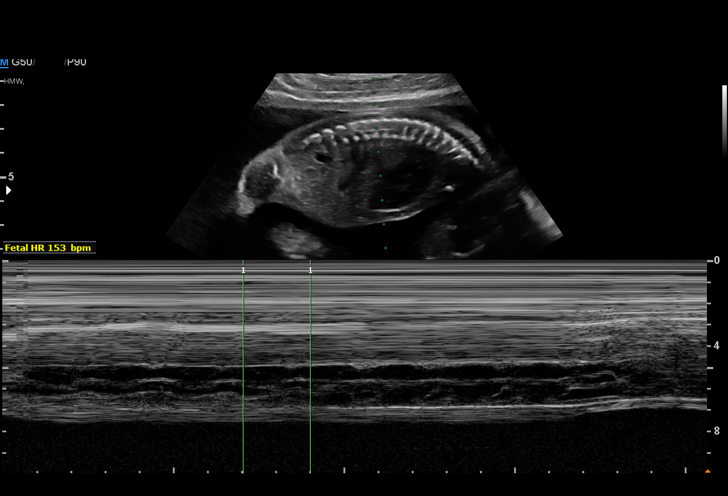
[im 21/61]
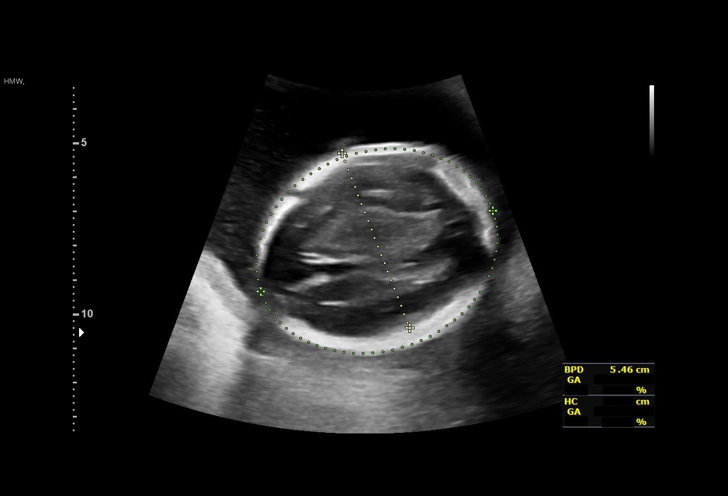
[im 25/61]
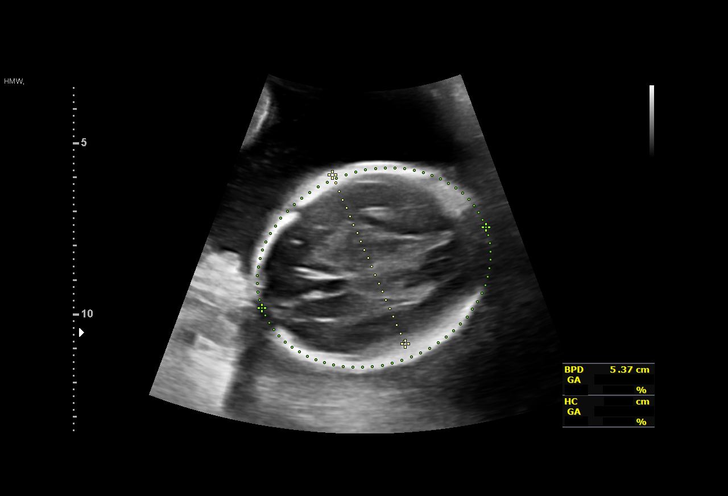
[im 32/61]
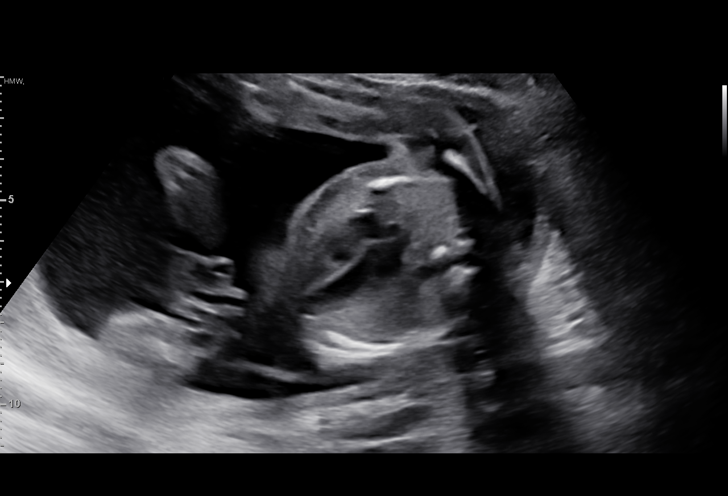
[im 36/61]
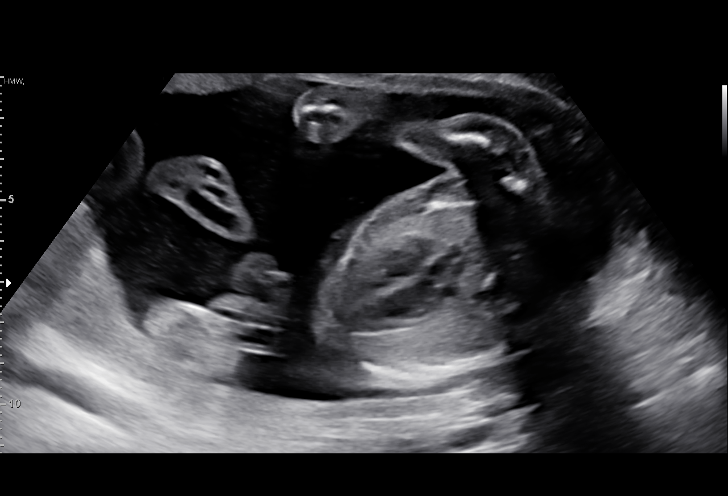
[im 41/61]
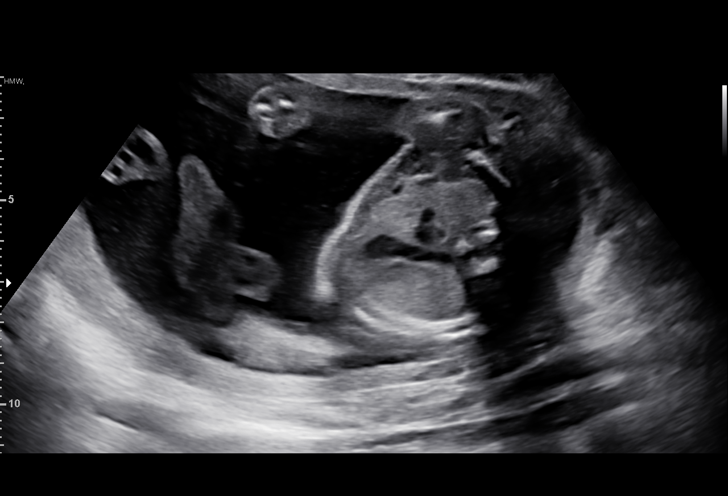
[im 45/61]
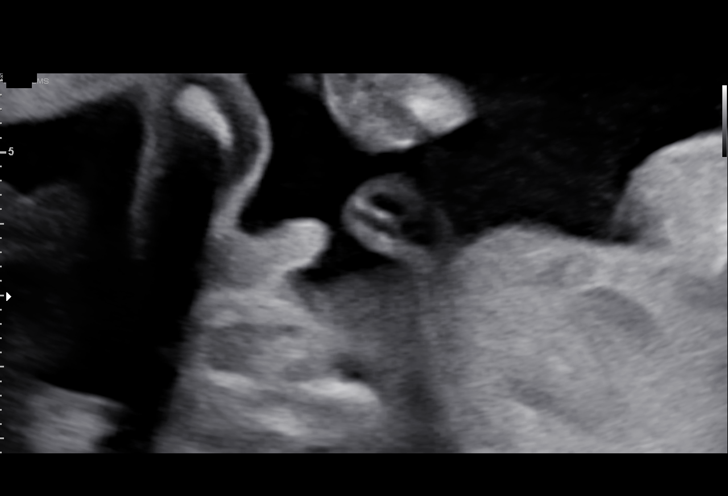
[im 49/61]
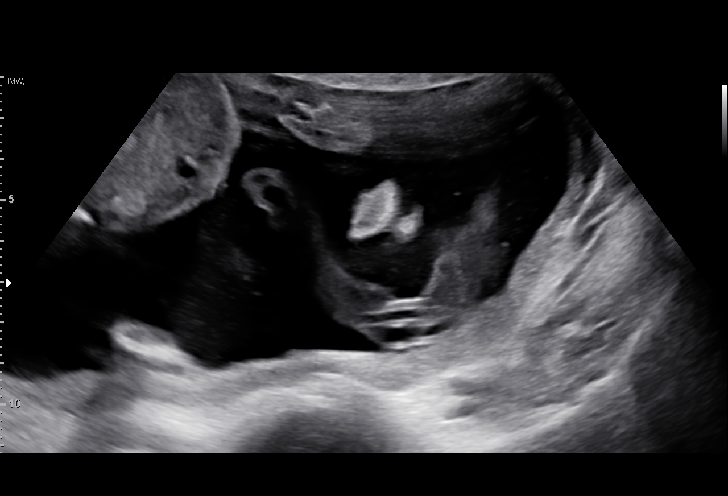
[im 54/61]
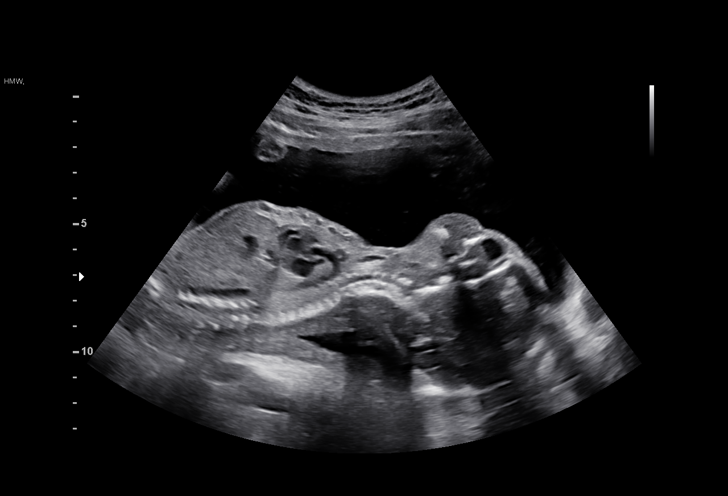
[im 58/61]
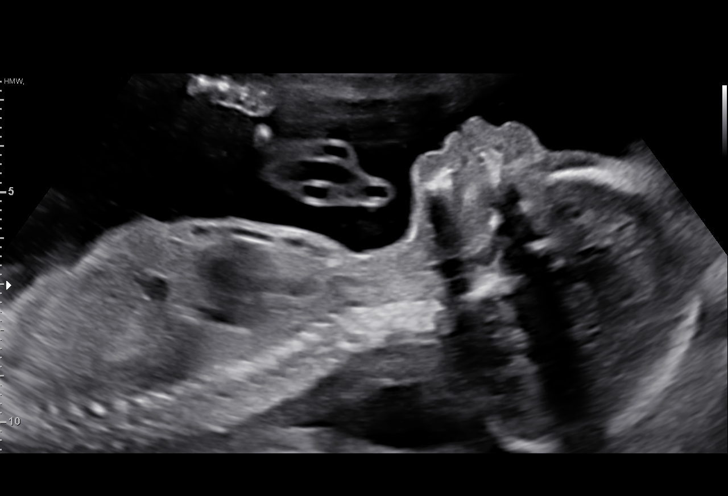

[13 of 28 positions shown; findings below may reference images not displayed]

[REDACTED]. [HOSPITAL],
                   [facility]

 ----------------------------------------------------------------------

 ----------------------------------------------------------------------
Indications

  Obesity complicating pregnancy, second
  trimester
  22 weeks gestation of pregnancy
  Negative Horizon (Low Risk NIPS)
  Encounter for other antenatal screening
  follow-up
 ----------------------------------------------------------------------
Vital Signs

                                                Height:        5'6"
Fetal Evaluation

 Num Of Fetuses:         1
 Fetal Heart Rate(bpm):  153
 Cardiac Activity:       Observed
 Presentation:           Cephalic
 Placenta:               Fundal
 P. Cord Insertion:      Visualized, central

 Amniotic Fluid
 AFI FV:      Within normal limits

                             Largest Pocket(cm)

Biometry

 BPD:      54.1  mm     G. Age:  22w 3d         60  %    CI:        72.62   %    70 - 86
                                                         FL/HC:      18.4   %    18.4 -
 HC:      201.9  mm     G. Age:  22w 2d         46  %    HC/AC:      1.19        1.06 -
 AC:      170.2  mm     G. Age:  22w 0d         37  %    FL/BPD:     68.6   %    71 - 87
 FL:       37.1  mm     G. Age:  21w 6d         29  %    FL/AC:      21.8   %    20 - 24
 HUM:      35.4  mm     G. Age:  22w 2d         50  %
 CER:      23.6  mm     G. Age:  21w 6d         42  %

 Est. FW:     466  gm           1 lb     35  %
OB History

 Gravidity:    2         Term:   0        Prem:   0        SAB:   1
 TOP:          0       Ectopic:  0        Living: 0
Gestational Age

 LMP:           23w 0d        Date:  12/18/18                 EDD:   09/24/19
 U/S Today:     22w 1d                                        EDD:   09/30/19
 Best:          22w 1d     Det. By:  U/S  (04/30/19)          EDD:   09/30/19
Anatomy

 Cranium:               Appears normal         Aortic Arch:            Appears normal
 Cavum:                 Appears normal         Ductal Arch:            Previously seen
 Ventricles:            Appears normal         Diaphragm:              Previously seen
 Choroid Plexus:        Previously seen        Stomach:                Appears normal, left
                                                                       sided
 Cerebellum:            Previously seen        Abdomen:                Previously seen
 Posterior Fossa:       Previously seen        Abdominal Wall:         Previously seen
 Nuchal Fold:           Previously seen        Cord Vessels:           Previously seen
 Face:                  Orbits and profile     Kidneys:                Appear normal
                        previously seen
 Lips:                  Appears normal         Bladder:                Appears normal
 Thoracic:              Appears normal         Spine:                  Previously seen
 Heart:                 Appears normal         Upper Extremities:      Previously seen
                        (4CH, axis, and
                        situs)
 RVOT:                  Appears normal         Lower Extremities:      Previously seen
 LVOT:                  Appears normal

 Other:  Male gender. Heels visualized previously. Technically difficult due to
         maternal habitus and fetal position.
Cervix Uterus Adnexa

 Cervix
 Length:            3.4  cm.
 Normal appearance by transabdominal scan.

 Uterus
 No abnormality visualized.

 Left Ovary
 No adnexal mass visualized.

 Right Ovary
 No adnexal mass visualized.

 Cul De Sac
 No free fluid seen.

 Adnexa
 No abnormality visualized.
Comments

 This patient was seen for a follow up exam as the fetal
 cardiac views were unable to be fully visualized during her
 prior exam and due to maternal obesity.  She denies any
 problems since her last exam.
 She was informed that the fetal growth and amniotic fluid
 level appears appropriate for her gestational age.
 The views of the fetal heart were visualized today.  There
 were no obvious anomalies suspected.  The limitations of
 ultrasound in the detection of all anomalies was discussed.
 Due to maternal obesity, a follow-up growth scan was
 scheduled in 4 weeks.

## 2020-02-13 IMAGING — US US MFM OB FOLLOW-UP
1 series · 14 of 28 positions shown · non-contrast
Comparison: none

[Series 1: us mfm ob follow-up · 40 acquisitions, 14 frames shown]
[im 2/40]
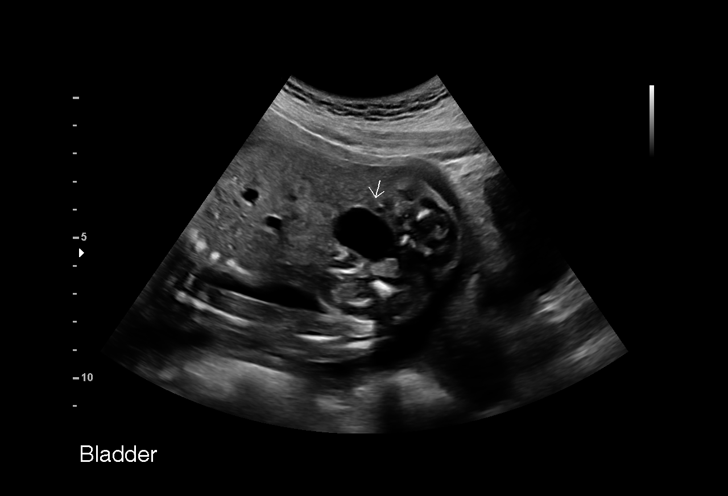
[im 5/40]
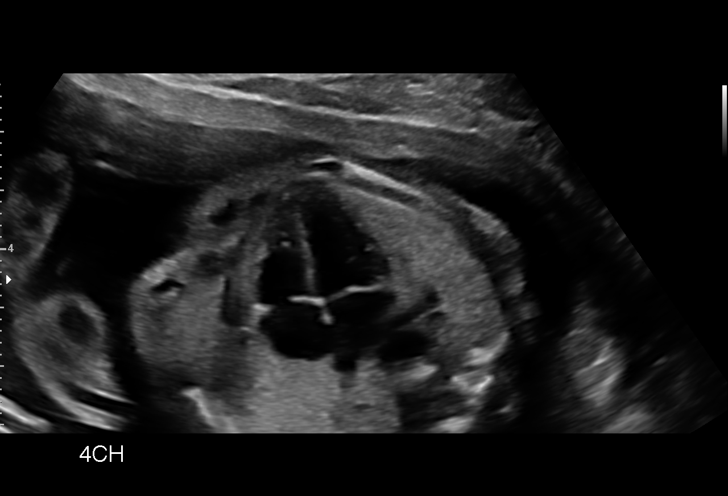
[im 8/40]
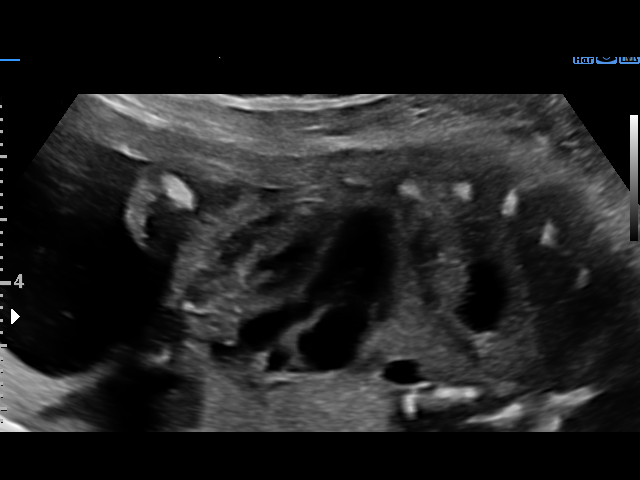
[im 11/40]
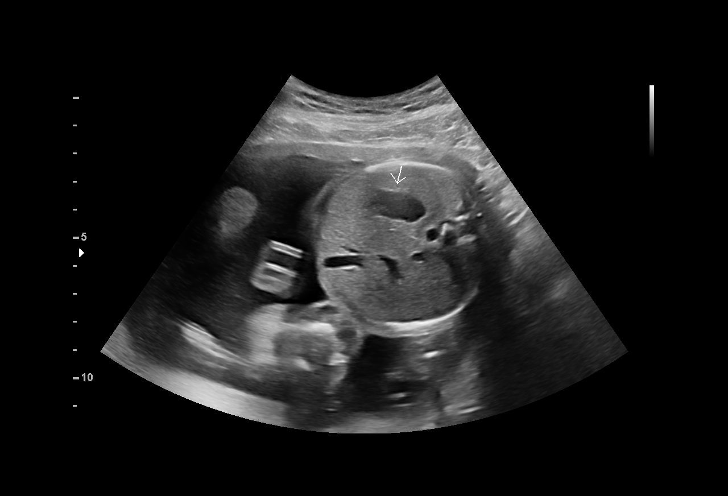
[im 14/40]
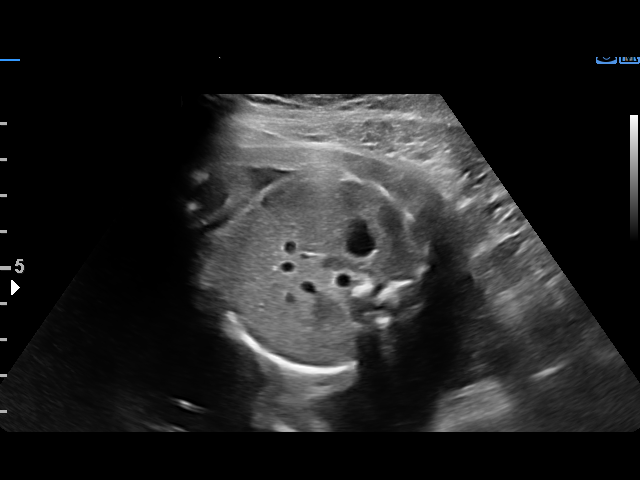
[im 16/40]
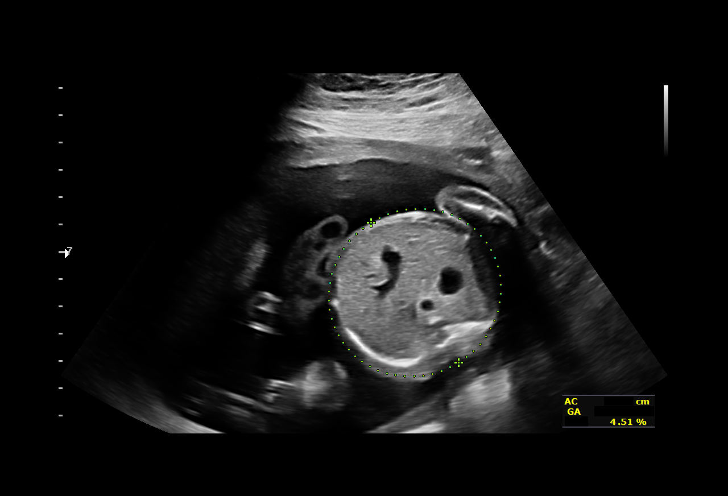
[im 19/40]
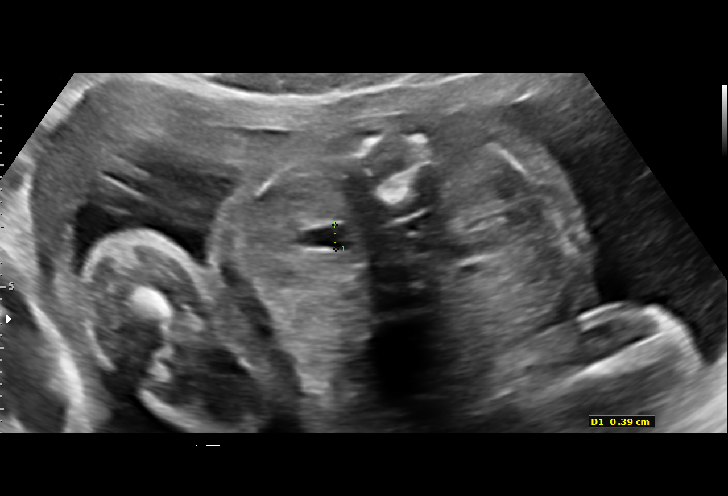
[im 22/40]
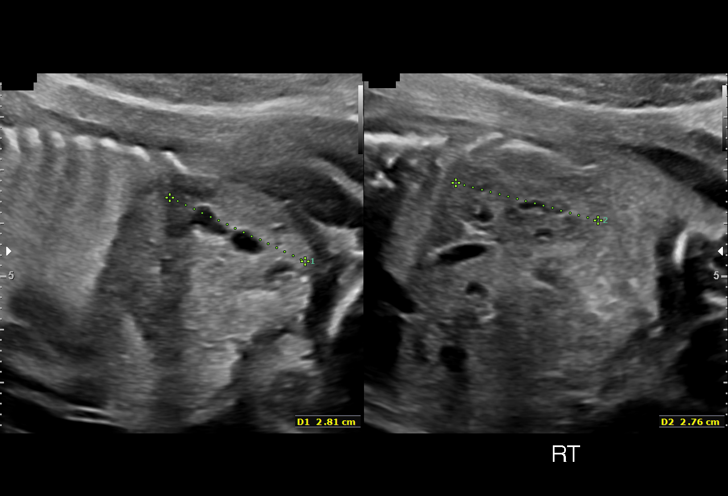
[im 25/40]
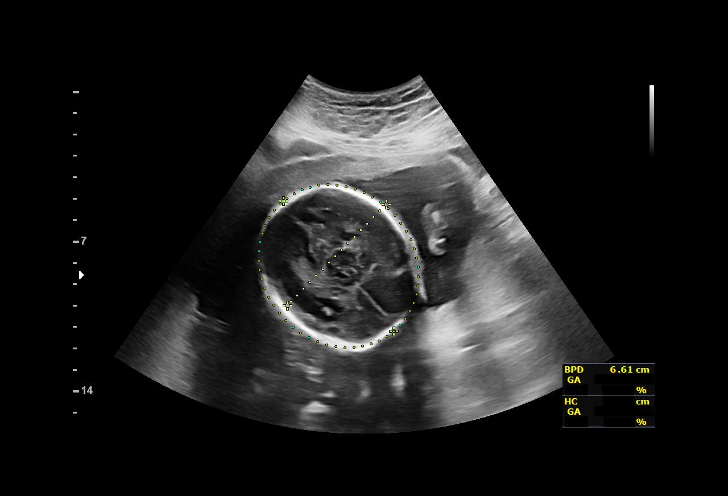
[im 28/40]
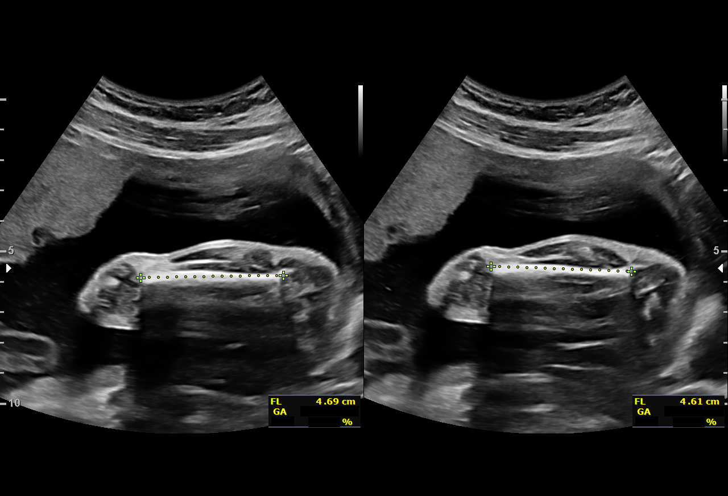
[im 31/40]
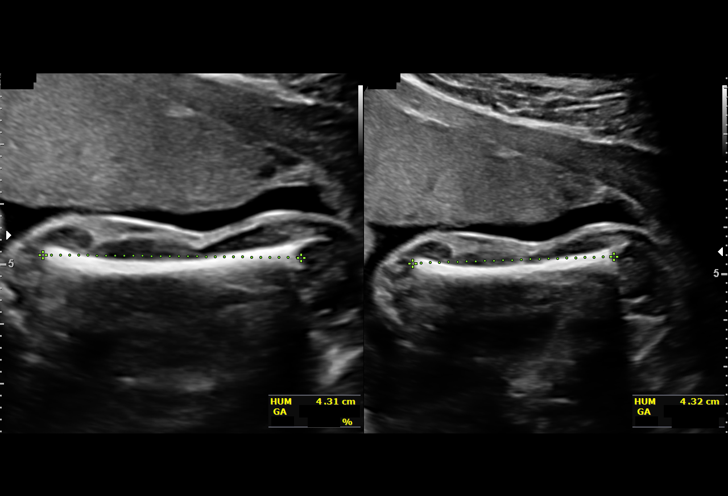
[im 34/40]
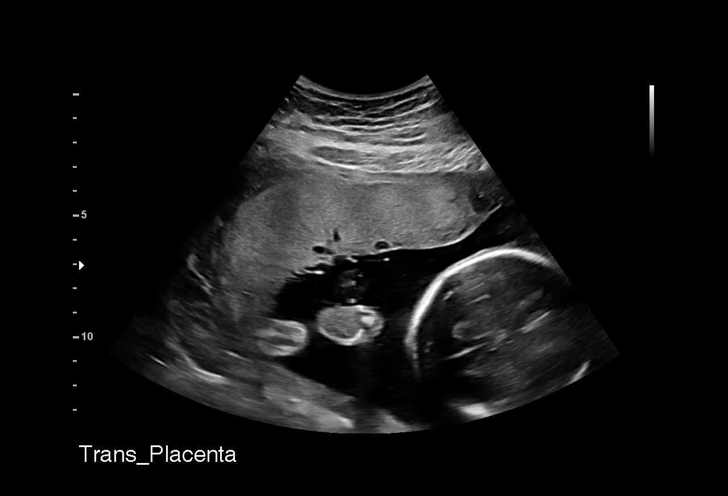
[im 37/40]
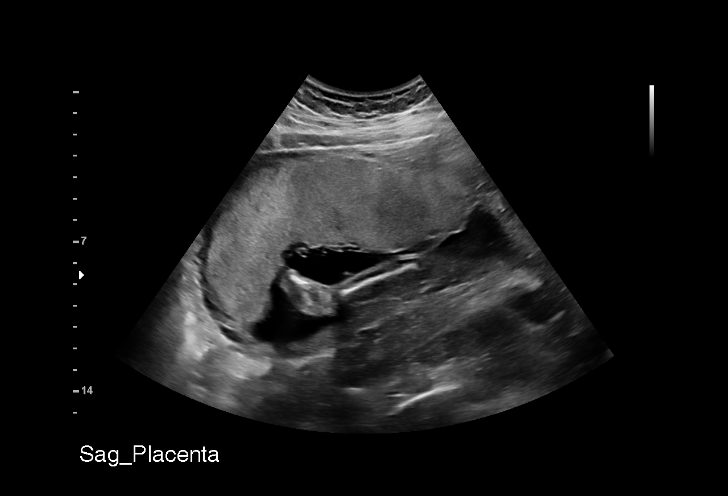
[im 40/40]
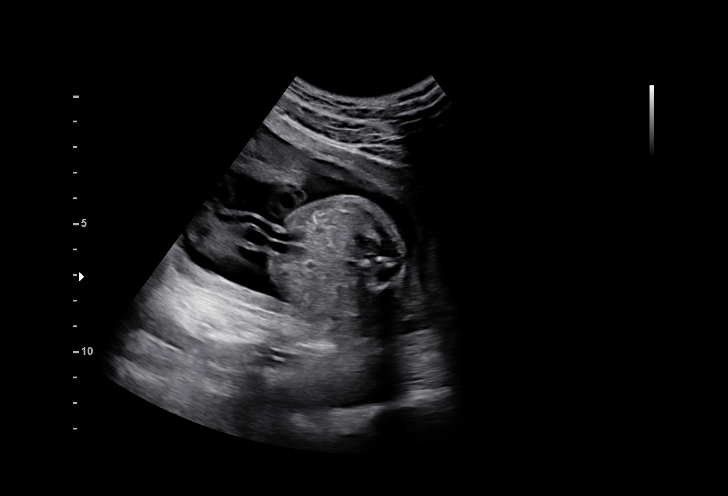

[14 of 28 positions shown; findings below may reference images not displayed]

[REDACTED]. [HOSPITAL],
                   [facility] CNM

 ----------------------------------------------------------------------

 ----------------------------------------------------------------------
Indications

  Obesity complicating pregnancy, second
  trimester (Pre Pregnancy BMI 33)
  Encounter for other antenatal screening
  follow-up
  25 weeks gestation of pregnancy
  Negative Horizon (Low Risk NIPS)
 ----------------------------------------------------------------------
Vital Signs

                                                Height:        5'6"
Fetal Evaluation

 Num Of Fetuses:         1
 Fetal Heart Rate(bpm):  136
 Cardiac Activity:       Observed
 Presentation:           Breech
 Placenta:               Fundal
 P. Cord Insertion:      Previously Visualized

 Amniotic Fluid
 AFI FV:      Within normal limits

                             Largest Pocket(cm)

Biometry

 BPD:        66  mm     G. Age:  26w 4d         67  %    CI:        77.28   %    70 - 86
                                                         FL/HC:      19.6   %    18.6 -
 HC:      237.7  mm     G. Age:  25w 6d         25  %    HC/AC:      1.16        1.04 -
 AC:      204.4  mm     G. Age:  25w 0d         18  %    FL/BPD:     70.8   %    71 - 87
 FL:       46.7  mm     G. Age:  25w 4d         27  %    FL/AC:      22.8   %    20 - 24
 HUM:        43  mm     G. Age:  25w 5d         42  %

 Est. FW:     803  gm    1 lb 12 oz      21  %
OB History

 Gravidity:    2         Term:   0        Prem:   0        SAB:   1
 TOP:          0       Ectopic:  0        Living: 0
Gestational Age

 LMP:           26w 5d        Date:  12/18/18                 EDD:   09/24/19
 U/S Today:     25w 5d                                        EDD:   10/01/19
 Best:          25w 6d     Det. By:  U/S  (04/30/19)          EDD:   09/30/19
Anatomy

 Cranium:               Appears normal         Aortic Arch:            Previously seen
 Cavum:                 Previously seen        Ductal Arch:            Previously seen
 Ventricles:            Previously seen        Diaphragm:              Previously seen
 Choroid Plexus:        Previously seen        Stomach:                Appears normal, left
                                                                       sided
 Cerebellum:            Previously seen        Abdomen:                Previously seen
 Posterior Fossa:       Previously seen        Abdominal Wall:         Previously seen
 Nuchal Fold:           Previously seen        Cord Vessels:           Previously seen
 Face:                  Orbits and profile     Kidneys:                Appear normal
                        previously seen
 Lips:                  Previously seen        Bladder:                Appears normal
 Thoracic:              Appears normal         Spine:                  Previously seen
 Heart:                 Appears normal         Upper Extremities:      Previously seen
                        (4CH, axis, and
                        situs)
 RVOT:                  Previously seen        Lower Extremities:      Previously seen
 LVOT:                  Appears normal

 Other:  Male gender. Heels visualized previously. Technically difficult due to
         maternal habitus and fetal position.
Cervix Uterus Adnexa

 Cervix
 Not visualized (advanced GA >16wks)
Comments

 This patient was seen for a follow up growth scan due to due
 to maternal obesity.  She denies any problems since her last
 exam.
 She was informed that the fetal growth and amniotic fluid
 level appears appropriate for her gestational age.
 Follow-up as indicated.

## 2020-07-12 DIAGNOSIS — L509 Urticaria, unspecified: Secondary | ICD-10-CM | POA: Diagnosis not present

## 2020-07-12 DIAGNOSIS — Z1152 Encounter for screening for COVID-19: Secondary | ICD-10-CM | POA: Diagnosis not present

## 2021-02-28 DIAGNOSIS — Z1152 Encounter for screening for COVID-19: Secondary | ICD-10-CM | POA: Diagnosis not present

## 2021-10-31 DIAGNOSIS — Z1379 Encounter for other screening for genetic and chromosomal anomalies: Secondary | ICD-10-CM | POA: Diagnosis not present

## 2021-10-31 DIAGNOSIS — O99352 Diseases of the nervous system complicating pregnancy, second trimester: Secondary | ICD-10-CM | POA: Diagnosis not present

## 2021-10-31 DIAGNOSIS — O0931 Supervision of pregnancy with insufficient antenatal care, first trimester: Secondary | ICD-10-CM | POA: Diagnosis not present

## 2021-10-31 DIAGNOSIS — Z369 Encounter for antenatal screening, unspecified: Secondary | ICD-10-CM | POA: Diagnosis not present

## 2021-10-31 DIAGNOSIS — Z3482 Encounter for supervision of other normal pregnancy, second trimester: Secondary | ICD-10-CM | POA: Diagnosis not present

## 2021-10-31 DIAGNOSIS — Z118 Encounter for screening for other infectious and parasitic diseases: Secondary | ICD-10-CM | POA: Diagnosis not present

## 2021-10-31 DIAGNOSIS — Z3202 Encounter for pregnancy test, result negative: Secondary | ICD-10-CM | POA: Diagnosis not present

## 2021-10-31 DIAGNOSIS — Z113 Encounter for screening for infections with a predominantly sexual mode of transmission: Secondary | ICD-10-CM | POA: Diagnosis not present

## 2021-10-31 DIAGNOSIS — Z114 Encounter for screening for human immunodeficiency virus [HIV]: Secondary | ICD-10-CM | POA: Diagnosis not present

## 2021-10-31 DIAGNOSIS — O09299 Supervision of pregnancy with other poor reproductive or obstetric history, unspecified trimester: Secondary | ICD-10-CM | POA: Diagnosis not present

## 2021-10-31 DIAGNOSIS — Z1389 Encounter for screening for other disorder: Secondary | ICD-10-CM | POA: Diagnosis not present

## 2021-10-31 DIAGNOSIS — Z1151 Encounter for screening for human papillomavirus (HPV): Secondary | ICD-10-CM | POA: Diagnosis not present

## 2021-10-31 DIAGNOSIS — O99212 Obesity complicating pregnancy, second trimester: Secondary | ICD-10-CM | POA: Diagnosis not present

## 2021-10-31 DIAGNOSIS — Z124 Encounter for screening for malignant neoplasm of cervix: Secondary | ICD-10-CM | POA: Diagnosis not present

## 2021-10-31 DIAGNOSIS — O23592 Infection of other part of genital tract in pregnancy, second trimester: Secondary | ICD-10-CM | POA: Diagnosis not present

## 2021-10-31 DIAGNOSIS — O09212 Supervision of pregnancy with history of pre-term labor, second trimester: Secondary | ICD-10-CM | POA: Diagnosis not present

## 2021-10-31 DIAGNOSIS — Z3A16 16 weeks gestation of pregnancy: Secondary | ICD-10-CM | POA: Diagnosis not present

## 2021-11-10 DIAGNOSIS — N6313 Unspecified lump in the right breast, lower outer quadrant: Secondary | ICD-10-CM | POA: Diagnosis not present

## 2021-11-10 DIAGNOSIS — N63 Unspecified lump in unspecified breast: Secondary | ICD-10-CM | POA: Diagnosis not present

## 2021-11-16 DIAGNOSIS — Z3687 Encounter for antenatal screening for uncertain dates: Secondary | ICD-10-CM | POA: Diagnosis not present

## 2021-11-23 DIAGNOSIS — Z124 Encounter for screening for malignant neoplasm of cervix: Secondary | ICD-10-CM | POA: Diagnosis not present

## 2021-11-23 DIAGNOSIS — Z3A11 11 weeks gestation of pregnancy: Secondary | ICD-10-CM | POA: Diagnosis not present

## 2021-11-23 DIAGNOSIS — Z36 Encounter for antenatal screening for chromosomal anomalies: Secondary | ICD-10-CM | POA: Diagnosis not present

## 2021-11-23 DIAGNOSIS — Z3491 Encounter for supervision of normal pregnancy, unspecified, first trimester: Secondary | ICD-10-CM | POA: Diagnosis not present

## 2021-11-28 DIAGNOSIS — M62838 Other muscle spasm: Secondary | ICD-10-CM | POA: Diagnosis not present

## 2021-11-28 DIAGNOSIS — R030 Elevated blood-pressure reading, without diagnosis of hypertension: Secondary | ICD-10-CM | POA: Diagnosis not present

## 2021-11-28 DIAGNOSIS — T148XXA Other injury of unspecified body region, initial encounter: Secondary | ICD-10-CM | POA: Diagnosis not present

## 2021-11-28 DIAGNOSIS — Z7689 Persons encountering health services in other specified circumstances: Secondary | ICD-10-CM | POA: Diagnosis not present

## 2022-01-20 DIAGNOSIS — L03113 Cellulitis of right upper limb: Secondary | ICD-10-CM | POA: Diagnosis not present

## 2022-01-23 DIAGNOSIS — Z8759 Personal history of other complications of pregnancy, childbirth and the puerperium: Secondary | ICD-10-CM | POA: Diagnosis not present
